# Patient Record
Sex: Female | Born: 1985 | Race: White | Hispanic: No | Marital: Married | Smoking: Never smoker
Health system: Southern US, Community
[De-identification: ages and names within clinical notes are randomized; demographics above are authoritative.]

## PROBLEM LIST (undated history)

## (undated) DIAGNOSIS — I341 Nonrheumatic mitral (valve) prolapse: Secondary | ICD-10-CM

## (undated) DIAGNOSIS — K219 Gastro-esophageal reflux disease without esophagitis: Secondary | ICD-10-CM

## (undated) DIAGNOSIS — D649 Anemia, unspecified: Secondary | ICD-10-CM

## (undated) DIAGNOSIS — G43909 Migraine, unspecified, not intractable, without status migrainosus: Secondary | ICD-10-CM

## (undated) DIAGNOSIS — I959 Hypotension, unspecified: Secondary | ICD-10-CM

## (undated) HISTORY — PX: TONSILLECTOMY AND ADENOIDECTOMY: SHX28

## (undated) HISTORY — PX: WISDOM TOOTH EXTRACTION: SHX21

## (undated) HISTORY — DX: Nonrheumatic mitral (valve) prolapse: I34.1

## (undated) HISTORY — DX: Migraine, unspecified, not intractable, without status migrainosus: G43.909

---
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
## 2007-08-13 ENCOUNTER — Emergency Department (HOSPITAL_COMMUNITY): Admission: EM | Admit: 2007-08-13 | Discharge: 2007-08-13 | Payer: Self-pay | Admitting: Family Medicine

## 2009-08-28 ENCOUNTER — Encounter: Admission: RE | Admit: 2009-08-28 | Discharge: 2009-08-28 | Payer: Self-pay | Admitting: Family Medicine

## 2011-09-25 ENCOUNTER — Ambulatory Visit (INDEPENDENT_AMBULATORY_CARE_PROVIDER_SITE_OTHER): Payer: 59 | Admitting: Internal Medicine

## 2011-09-25 VITALS — BP 110/68 | HR 67 | Temp 98.3°F | Resp 16 | Ht 64.0 in | Wt 120.0 lb

## 2011-09-25 DIAGNOSIS — L259 Unspecified contact dermatitis, unspecified cause: Secondary | ICD-10-CM

## 2011-09-25 MED ORDER — TRIAMCINOLONE ACETONIDE 0.1 % EX CREA: TOPICAL_CREAM | Freq: Two times a day (BID) | CUTANEOUS | Status: AC

## 2011-09-25 MED ORDER — PREDNISONE 20 MG PO TABS: ORAL_TABLET | ORAL | Status: AC

## 2011-09-25 NOTE — Progress Notes (Signed)
  Subjective:    Patient ID: Judy [name], female    DOB: 05-12-1986, 26 y.o.   MRN: [id number]  Rash This is a new problem. The current episode started in the past 7 days. The problem has been gradually worsening since onset. The rash is diffuse. The rash is characterized by blistering, itchiness, redness and draining. She was exposed to plant contact. Past treatments include anti-itch cream.  Judy [name] is here with a rash that appeared after working in the yard with her Mother.  She has had red bumps that are draining starting on her right lower arm, now on her trunk, back and extremities, very itchy.  She has had poison ivey before and saw some in the yard when she was working last week.  She is currently on her menses, denies pregnancy.    Review of Systems  Skin: Positive for rash.  All other systems reviewed and are negative.       Objective:   Physical Exam  Constitutional: She is oriented to person, place, and time. She appears well-developed and well-nourished.  Cardiovascular: Normal rate, regular rhythm and normal heart sounds.   Pulmonary/Chest: Effort normal and breath sounds normal. She has no wheezes.  Neurological: She is alert and oriented to person, place, and time.  Skin: Rash noted. There is erythema.       REd linear papular rash on back with scattered red papules on arms, chest, and large patch of yellow crusted papules on right lower arm.          Assessment & Plan:  Contact Dermatitis, diffuse.  Prednisone taper for nine days given.  Triamcinolone cream topically to trunk and extremities only (not to apply to face or genitals).  Pt agrees with plan.  AVS printed and given to pt.

## 2011-09-25 NOTE — Patient Instructions (Signed)
Use your cream and take your prednisone as directed.  REturn if not better in 2-3 days. Contact Dermatitis Contact dermatitis is a reaction to certain substances that touch the skin. Contact dermatitis can be either irritant contact dermatitis or allergic contact dermatitis. Irritant contact dermatitis does not require previous exposure to the substance for a reaction to occur.Allergic contact dermatitis only occurs if you have been exposed to the substance before. Upon a repeat exposure, your body reacts to the substance.  CAUSES  Many substances can cause contact dermatitis. Irritant dermatitis is most commonly caused by repeated exposure to mildly irritating substances, such as:  Makeup.   Soaps.   Detergents.   Bleaches.   Acids.   Metal salts, such as nickel.  Allergic contact dermatitis is most commonly caused by exposure to:  Poisonous plants.   Chemicals (deodorants, shampoos).   Jewelry.   Latex.   Neomycin in triple antibiotic cream.   Preservatives in products, including clothing.  SYMPTOMS  The area of skin that is exposed may develop:  Dryness or flaking.   Redness.   Cracks.   Itching.   Pain or a burning sensation.   Blisters.  With allergic contact dermatitis, there may also be swelling in areas such as the eyelids, mouth, or genitals.  DIAGNOSIS  Your caregiver can usually tell what the problem is by doing a physical exam. In cases where the cause is uncertain and an allergic contact dermatitis is suspected, a patch skin test may be performed to help determine the cause of your dermatitis. TREATMENT Treatment includes protecting the skin from further contact with the irritating substance by avoiding that substance if possible. Barrier creams, powders, and gloves may be helpful. Your caregiver may also recommend:  Steroid creams or ointments applied 2 times daily. For best results, soak the rash area in cool water for 20 minutes. Then apply the  medicine. Cover the area with a plastic wrap. You can store the steroid cream in the refrigerator for a "chilly" effect on your rash. That may decrease itching. Oral steroid medicines may be needed in more severe cases.   Antibiotics or antibacterial ointments if a skin infection is present.   Antihistamine lotion or an antihistamine taken by mouth to ease itching.   Lubricants to keep moisture in your skin.   Burow's solution to reduce redness and soreness or to dry a weeping rash. Mix one packet or tablet of solution in 2 cups cool water. Dip a clean washcloth in the mixture, wring it out a bit, and put it on the affected area. Leave the cloth in place for 30 minutes. Do this as often as possible throughout the day.   Taking several cornstarch or baking soda baths daily if the area is too large to cover with a washcloth.  Harsh chemicals, such as alkalis or acids, can cause skin damage that is like a burn. You should flush your skin for 15 to 20 minutes with cold water after such an exposure. You should also seek immediate medical care after exposure. Bandages (dressings), antibiotics, and pain medicine may be needed for severely irritated skin.  HOME CARE INSTRUCTIONS  Avoid the substance that caused your reaction.   Keep the area of skin that is affected away from hot water, soap, sunlight, chemicals, acidic substances, or anything else that would irritate your skin.   Do not scratch the rash. Scratching may cause the rash to become infected.   You may take cool baths to help stop  the itching.   Only take over-the-counter or prescription medicines as directed by your caregiver.   See your caregiver for follow-up care as directed to make sure your skin is healing properly.  SEEK MEDICAL CARE IF:   Your condition is not better after 3 days of treatment.   You seem to be getting worse.   You see signs of infection such as swelling, tenderness, redness, soreness, or warmth in the  affected area.   You have any problems related to your medicines.  Document Released: 06/19/2000 Document Revised: 06/11/2011 Document Reviewed: 11/25/2010 [facility] Patient Information 2012 [place], [place].

## 2011-11-11 DIAGNOSIS — I340 Nonrheumatic mitral (valve) insufficiency: Secondary | ICD-10-CM | POA: Insufficient documentation

## 2011-11-11 DIAGNOSIS — Q211 Atrial septal defect: Secondary | ICD-10-CM | POA: Insufficient documentation

## 2012-11-10 ENCOUNTER — Telehealth: Payer: Self-pay | Admitting: *Deleted

## 2012-11-10 NOTE — Telephone Encounter (Signed)
Pt R/S to 11/18/2012

## 2012-11-14 ENCOUNTER — Telehealth: Payer: Self-pay | Admitting: *Deleted

## 2012-11-14 NOTE — Telephone Encounter (Signed)
Left message for patient appointment.

## 2012-11-15 ENCOUNTER — Telehealth: Payer: Self-pay | Admitting: *Deleted

## 2012-11-15 NOTE — Telephone Encounter (Signed)
R/S patient to 11/22/2012

## 2012-11-16 ENCOUNTER — Ambulatory Visit: Payer: 59 | Admitting: Neurology

## 2012-11-18 ENCOUNTER — Ambulatory Visit: Payer: Self-pay | Admitting: Neurology

## 2012-11-21 ENCOUNTER — Ambulatory Visit: Payer: Self-pay | Admitting: Neurology

## 2012-11-22 ENCOUNTER — Encounter: Payer: Self-pay | Admitting: Neurology

## 2012-11-22 ENCOUNTER — Ambulatory Visit (INDEPENDENT_AMBULATORY_CARE_PROVIDER_SITE_OTHER): Payer: 59 | Admitting: Neurology

## 2012-11-22 VITALS — BP 101/65 | HR 63 | Ht 64.0 in | Wt 121.0 lb

## 2012-11-22 DIAGNOSIS — G43909 Migraine, unspecified, not intractable, without status migrainosus: Secondary | ICD-10-CM

## 2012-11-22 MED ORDER — NORTRIPTYLINE HCL 10 MG PO CAPS: 20.0000 mg | ORAL_CAPSULE | Freq: Every day | ORAL | Status: DC

## 2012-11-22 NOTE — Patient Instructions (Signed)
Magnesium oxide 400mg bid, riboflavin 100mg bid. 

## 2012-11-22 NOTE — Progress Notes (Signed)
History of present illness:  Judy [name] is a 27 years old right-handed Caucasian female, referred by her primary care physician for evaluation of migraine headaches She presented with migraine headaches since age 43, her typical migraine that provides severe pounding headaches, with associated light noise sensitivity, sometimes nausea, sleep always helps,  Trigger for her migraines are stress, sleep deprivation, hungry, whether change, premenstrual cycle, she usually has visual aura, blurry vision, tunnel vision lasting 30 minutes before the onset of severe headaches  She has had migraine a few times a year, but in past one year , she has been has more frequent prolonged, more severe headaches, she had 9 migraines over past 2 months,    she has tried Imitrex in the past, causing flushing, no help, Maxalt was helpful initially, but  loose  its effectiveness after she used it for a while, Midrin was helpful but no longer in the market  She was given prescription of isometheptena-caffeine Tylenol by her primary care physician, which has been very effective, it takes away her headache in 30 minutes,  In between headaches, she is normal, she wants to find a way to cut back her frequency and severity of the headaches,  Review of Systems  Out of a complete 14 system review, the patient complains of only the following symptoms, and all other reviewed systems are negative.   Constitutional:   N/A Cardiovascular:  N/A Ear/Nose/Throat:  N/A Skin: N/A Eyes: N/A Respiratory: N/A Gastroitestinal: N/A    Hematology/Lymphatic:  N/A Endocrine:  N/A Musculoskeletal:N/A Allergy/Immunology: allergy Neurological: headache Psychiatric:    Decreased energy  PHYSICAL EXAMINATOINS:  Generalized: In no acute distress  Neck: Supple, no carotid bruits   Cardiac: Regular rate rhythm  Pulmonary: Clear to auscultation bilaterally  Musculoskeletal: No deformity  Neurological examination  Mentation: Alert  oriented to time, place, history taking, and causual conversation  Cranial nerve II-XII: Pupils were equal round reactive to light extraocular movements were full, visual field were full on confrontational test. facial sensation and strength were normal. hearing was intact to finger rubbing bilaterally. Uvula tongue midline.  head turning and shoulder shrug and were normal and symmetric.Tongue protrusion into cheek strength was normal.  Motor: normal tone, bulk and strength.  Sensory: Intact to fine touch, pinprick, preserved vibratory sensation, and proprioception at toes.  Coordination: Normal finger to nose, heel-to-shin bilaterally there was no truncal ataxia  Gait: Rising up from seated position without assistance, normal stance, without trunk ataxia, moderate stride, good arm swing, smooth turning, able to perform tiptoe, and heel walking without difficulty.   Romberg signs: Negative  Deep tendon reflexes: Brachioradialis 2/2, biceps 2/2, triceps 2/2, patellar 2/2, Achilles 2/2, plantar responses were flexor bilaterally.   Assessment and plan:  27 Years old right-handed Caucasian female, with past medical history of migraine headaches, now with increased frequency, normal neurological examination  1: Stop preventive medications, nortriptylin 10 mg titrating to 20 mg every night, she may also consider over-the-counter magnesium oxide, riboflavin,. 2 continue preventive medication Isomethept-caffeine-Tylenol as needed 3 return to clinic with Judy [name] in 3 months.

## 2013-02-23 ENCOUNTER — Telehealth: Payer: Self-pay | Admitting: Nurse Practitioner

## 2013-02-24 ENCOUNTER — Ambulatory Visit: Payer: 59 | Admitting: Nurse Practitioner

## 2013-08-02 DIAGNOSIS — Z3490 Encounter for supervision of normal pregnancy, unspecified, unspecified trimester: Secondary | ICD-10-CM | POA: Insufficient documentation

## 2013-08-24 NOTE — Telephone Encounter (Signed)
Pt was a no show closing encounter °

## 2014-01-07 ENCOUNTER — Inpatient Hospital Stay: Payer: Self-pay | Admitting: Obstetrics & Gynecology

## 2014-01-07 LAB — CBC WITH DIFFERENTIAL/PLATELET
Basophil #: 0 10*3/uL (ref 0.0–0.1)
Basophil %: 0.3 %
Eosinophil #: 0.1 10*3/uL (ref 0.0–0.7)
Eosinophil %: 1.1 %
HCT: 35.1 % (ref 35.0–47.0)
HGB: 11.5 g/dL — AB (ref 12.0–16.0)
Lymphocyte #: 1.7 10*3/uL (ref 1.0–3.6)
Lymphocyte %: 14.6 %
MCH: 31 pg (ref 26.0–34.0)
MCHC: 32.9 g/dL (ref 32.0–36.0)
MCV: 94 fL (ref 80–100)
MONOS PCT: 5.9 %
Monocyte #: 0.7 x10 3/mm (ref 0.2–0.9)
Neutrophil #: 9.3 10*3/uL — ABNORMAL HIGH (ref 1.4–6.5)
Neutrophil %: 78.1 %
Platelet: 201 10*3/uL (ref 150–440)
RBC: 3.72 10*6/uL — AB (ref 3.80–5.20)
RDW: 13.8 % (ref 11.5–14.5)
WBC: 11.9 10*3/uL — ABNORMAL HIGH (ref 3.6–11.0)

## 2014-01-08 ENCOUNTER — Inpatient Hospital Stay (HOSPITAL_COMMUNITY)
Admission: AD | Admit: 2014-01-08 | Discharge: 2014-01-09 | DRG: 776 | Disposition: A | Discharge status: Home / Self Care | Payer: 59 | Source: Ambulatory Visit | Attending: Family Medicine | Admitting: Family Medicine

## 2014-01-08 ENCOUNTER — Encounter (HOSPITAL_COMMUNITY): Payer: Self-pay | Admitting: *Deleted

## 2014-01-08 DIAGNOSIS — O9989 Other specified diseases and conditions complicating pregnancy, childbirth and the puerperium: Secondary | ICD-10-CM

## 2014-01-08 DIAGNOSIS — Z09 Encounter for follow-up examination after completed treatment for conditions other than malignant neoplasm: Secondary | ICD-10-CM

## 2014-01-08 DIAGNOSIS — I2510 Atherosclerotic heart disease of native coronary artery without angina pectoris: Secondary | ICD-10-CM | POA: Diagnosis present

## 2014-01-08 DIAGNOSIS — O9943 Diseases of the circulatory system complicating the puerperium: Secondary | ICD-10-CM

## 2014-01-08 DIAGNOSIS — I059 Rheumatic mitral valve disease, unspecified: Secondary | ICD-10-CM | POA: Diagnosis present

## 2014-01-08 DIAGNOSIS — O99893 Other specified diseases and conditions complicating puerperium: Secondary | ICD-10-CM

## 2014-01-08 DIAGNOSIS — G43909 Migraine, unspecified, not intractable, without status migrainosus: Secondary | ICD-10-CM

## 2014-01-08 LAB — OB RESULTS CONSOLE ABO/RH: RH Type: POSITIVE

## 2014-01-08 LAB — OB RESULTS CONSOLE HIV ANTIBODY (ROUTINE TESTING)
HIV: NONREACTIVE
HIV: NONREACTIVE

## 2014-01-08 LAB — OB RESULTS CONSOLE HEPATITIS B SURFACE ANTIGEN: Hepatitis B Surface Ag: NEGATIVE

## 2014-01-08 LAB — OB RESULTS CONSOLE GBS
GBS: NEGATIVE
STREP GROUP B AG: NEGATIVE

## 2014-01-08 LAB — OB RESULTS CONSOLE ANTIBODY SCREEN: ANTIBODY SCREEN: NEGATIVE

## 2014-01-08 LAB — OB RESULTS CONSOLE RUBELLA ANTIBODY, IGM: RUBELLA: IMMUNE

## 2014-01-08 MED ORDER — OXYCODONE-ACETAMINOPHEN 5-325 MG PO TABS
1.0000 | ORAL_TABLET | ORAL | Status: DC | PRN
  Administered 2014-01-09 (×2): 1 via ORAL
  Filled 2014-01-08 (×2): qty 1

## 2014-01-08 MED ORDER — SIMETHICONE 80 MG PO CHEW
80.0000 mg | CHEWABLE_TABLET | ORAL | Status: DC
  Administered 2014-01-08: 80 mg via ORAL
  Filled 2014-01-08: qty 1

## 2014-01-08 MED ORDER — MENTHOL 3 MG MT LOZG: 1.0000 | LOZENGE | OROMUCOSAL | Status: DC | PRN

## 2014-01-08 MED ORDER — LACTATED RINGERS IV SOLN
INTRAVENOUS | Status: DC
  Administered 2014-01-08: 23:00:00 via INTRAVENOUS

## 2014-01-08 MED ORDER — ZOLPIDEM TARTRATE 5 MG PO TABS: 5.0000 mg | ORAL_TABLET | Freq: Every evening | ORAL | Status: DC | PRN

## 2014-01-08 MED ORDER — PRENATAL MULTIVITAMIN CH
1.0000 | ORAL_TABLET | Freq: Every day | ORAL | Status: DC
  Administered 2014-01-09: 1 via ORAL
  Filled 2014-01-08: qty 1

## 2014-01-08 MED ORDER — ONDANSETRON HCL 4 MG/2ML IJ SOLN: 4.0000 mg | INTRAMUSCULAR | Status: DC | PRN

## 2014-01-08 MED ORDER — WITCH HAZEL-GLYCERIN EX PADS: 1.0000 "application " | MEDICATED_PAD | CUTANEOUS | Status: DC | PRN

## 2014-01-08 MED ORDER — SIMETHICONE 80 MG PO CHEW
80.0000 mg | CHEWABLE_TABLET | Freq: Three times a day (TID) | ORAL | Status: DC
  Administered 2014-01-08: 80 mg via ORAL
  Filled 2014-01-08: qty 1

## 2014-01-08 MED ORDER — IBUPROFEN 600 MG PO TABS
600.0000 mg | ORAL_TABLET | Freq: Four times a day (QID) | ORAL | Status: DC
  Administered 2014-01-08 – 2014-01-09 (×5): 600 mg via ORAL
  Filled 2014-01-08 (×5): qty 1

## 2014-01-08 MED ORDER — DIPHENHYDRAMINE HCL 25 MG PO CAPS: 25.0000 mg | ORAL_CAPSULE | Freq: Four times a day (QID) | ORAL | Status: DC | PRN

## 2014-01-08 MED ORDER — DIBUCAINE 1 % RE OINT: 1.0000 "application " | TOPICAL_OINTMENT | RECTAL | Status: DC | PRN

## 2014-01-08 MED ORDER — ONDANSETRON HCL 4 MG PO TABS: 4.0000 mg | ORAL_TABLET | ORAL | Status: DC | PRN

## 2014-01-08 MED ORDER — LANOLIN HYDROUS EX OINT: 1.0000 "application " | TOPICAL_OINTMENT | CUTANEOUS | Status: DC | PRN

## 2014-01-08 MED ORDER — OXYTOCIN 40 UNITS IN LACTATED RINGERS INFUSION - SIMPLE MED: 62.5000 mL/h | INTRAVENOUS | Status: AC

## 2014-01-08 MED ORDER — SENNOSIDES-DOCUSATE SODIUM 8.6-50 MG PO TABS
2.0000 | ORAL_TABLET | ORAL | Status: DC
  Administered 2014-01-08: 2 via ORAL
  Filled 2014-01-08: qty 2

## 2014-01-08 MED ORDER — TETANUS-DIPHTH-ACELL PERTUSSIS 5-2.5-18.5 LF-MCG/0.5 IM SUSP: 0.5000 mL | Freq: Once | INTRAMUSCULAR | Status: DC

## 2014-01-08 MED ORDER — SIMETHICONE 80 MG PO CHEW: 80.0000 mg | CHEWABLE_TABLET | ORAL | Status: DC | PRN

## 2014-01-08 NOTE — Progress Notes (Signed)
I have been checking in on her family since this morning when I met them in the NICU.  I introduced myself to Judy [name], as she and her husband stood outside of the NICU.  She is overwhelmed and in shock at this point.  We will continue to provide her with support over the coming days, but please also page as needs arise.  [employer] Pager, [phone] 4:25 PM   01/08/14 1600  Clinical Encounter Type  Visited With Patient and family together  Visit Type Spiritual support

## 2014-01-08 NOTE — H&P (Signed)
Judy [name] is a 28 y.o. female G2P1001 S/P C-Section at [facility]. Her baby was transferred for care in the NICU, and she was transferred here as well to be near her baby. She experienced no intrapartum or postpartum complications. Baby was transferred 2/2 being delivered through meconium stained fluid, and difficulty with resuscitation. She currently, has pain well controlled on PCA pump. She has some mild swelling in her feet. She denies SOB, fever, chills, nausea, or vomiting. She is hungry and will try to eat this evening. She has been able to move around well with little limitation due to the pain.  Pertinent medical problems include mitral valve prolapse.  She is currently comfortable.   Prenatal History/Complications:  Past Medical History: Past Medical History  Diagnosis Date  . Migraines   . MVP (mitral valve prolapse)     Past Surgical History: Past Surgical History  Procedure Laterality Date  . Tonsillectomy and adenoidectomy    . Wisdom tooth extraction    . Cesarean section      Obstetrical History: OB History   Grav Para Term Preterm Abortions TAB SAB Ect Mult Living   2 1 1  0 0 0 0 0 0 1      Gynecological History: OB History   Grav Para Term Preterm Abortions TAB SAB Ect Mult Living   2 1 1  0 0 0 0 0 0 1      Social History: History   Social History  . Marital Status: Single    Spouse Name: John    Number of Children: 0  . Years of Education: college   Occupational History  . working in [employer]     [employer].   Social History Main Topics  . Smoking status: Never Smoker   . Smokeless tobacco: Never Used  . Alcohol Use: No     Comment: Rare  . Drug Use: No  . Sexual Activity: Yes   Other Topics Concern  . None   Social History Narrative   Patient lives at home with husband Jonny Ruiz(John). Patient does not have children. 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     Family History: No family history on  file.  Allergies: Allergies  Allergen Reactions  . Augmentin [Amoxicillin-Pot Clavulanate] Nausea And Vomiting  . Clindamycin/Lincomycin Rash    Prescriptions prior to admission  Medication Sig Dispense Refill  . APAP-Isometheptene-Caffeine 325-65-100 MG CAPS Take by mouth as directed.      Marland Kitchen. Fexofenadine HCl (ALLEGRA PO) Take by mouth daily.      . norethindrone (ORTHO MICRONOR) 0.35 MG tablet Take 1 tablet by mouth daily.      . nortriptyline (PAMELOR) 10 MG capsule Take 2 capsules (20 mg total) by mouth at bedtime. One po qhs xone week.  60 capsule  12     Review of Systems   Constitutional: As above  Blood pressure 109/72, pulse 89, temperature 98.1 F (36.7 C), temperature source Oral, resp. rate 16, height 5\' 4"  (1.626 m), weight 77.111 kg (170 lb), SpO2 97.00%, currently breastfeeding. General appearance: alert, cooperative and no distress Lungs: clear to auscultation bilaterally Heart: regular rate and rhythm, systolic click   Abdomen: soft, mildly tender 2/2 c-section, incision c/d/i, pca in place, no audible bowel sounds at this time.  Pelvic: did not perform 2/2 recent c-section Extremities: Homans sign is negative, no sign of DVT, no calf tenderness, mild lower extremity swelling.  Neurologically grossly intact Presentation: s/p delivery Fetal monitorings/p delivery  Uterine activitys/p delivery         Results for orders placed during the hospital encounter of 01/08/14 (from the past 24 hour(s))  OB RESULTS CONSOLE GBS   Collection Time    01/08/14 12:00 AM      Result Value Ref Range   GBS Negative    OB RESULTS CONSOLE GBS   Collection Time    01/08/14 12:00 AM      Result Value Ref Range   GBS Negative    OB RESULTS CONSOLE HIV ANTIBODY (ROUTINE TESTING)   Collection Time    01/08/14 12:00 AM      Result Value Ref Range   HIV Non-reactive    OB RESULTS CONSOLE RUBELLA ANTIBODY, IGM   Collection Time    01/08/14 12:00 AM      Result Value Ref  Range   Rubella Immune    OB RESULTS CONSOLE HEPATITIS B SURFACE ANTIGEN   Collection Time    01/08/14 12:00 AM      Result Value Ref Range   Hepatitis B Surface Ag Negative    OB RESULTS CONSOLE HIV ANTIBODY (ROUTINE TESTING)   Collection Time    01/08/14 12:00 AM      Result Value Ref Range   HIV Non-reactive    OB RESULTS CONSOLE ABO/RH   Collection Time    01/08/14 12:00 AM      Result Value Ref Range   RH Type  Positive     ABO Grouping O    OB RESULTS CONSOLE ANTIBODY SCREEN   Collection Time    01/08/14 12:00 AM      Result Value Ref Range   Antibody Screen Negative      Assessment: Judy [name] is a 28 y.o. G2P1001 here for care s/p cesarean delivery POD#0. Here with baby.  #Labor: s/p cesarean delivery #Pain: Controlled on PCA #FWB: In NICU #ID:  N/A # Course: Mother to recover here with baby prior to discharge home.   Melancon, Hillery HunterCaleb G 01/08/2014, 4:13 PM  Evaluation and management procedures were performed by Resident physician under my supervision/collaboration. Chart reviewed, patient examined by me and I agree with management and plan.

## 2014-01-08 NOTE — Consult Note (Signed)
Judy Levinshristine Anne Lilli Dewald, RN Registered Nurse Addendum Lactation Lactation Note Service date: 01/08/2014 2:08 PM   Lactation Consultation Note Initial consult with this mom of a term baby, [facility], with diagnosis of meconium aspiration, perinatal depression, on a cooling blanket. Mom was also [facility], s/p C-section this morning. Mom wants to provide EBM for her baby, and has pumped once since [facility]. I entered mom's room, and the room was full of visitors. Dad also present. Mom asked that I return later to do DEP teaching with her, so i left her the NLICU booklet on how to provide EBm for a NICU baby, and her labels, and told her I would wait for her nurse to call me, before returning.  Patient Name: Judy [name]  ZOXWR'UToday's Date: 01/08/2014  Reason for consult: Initial assessment;NICU baby

## 2014-01-09 LAB — CBC
HCT: 30.6 % — ABNORMAL LOW (ref 36.0–46.0)
Hemoglobin: 9.8 g/dL — ABNORMAL LOW (ref 12.0–15.0)
MCH: 30.4 pg (ref 26.0–34.0)
MCHC: 32 g/dL (ref 30.0–36.0)
MCV: 95 fL (ref 78.0–100.0)
PLATELETS: 167 10*3/uL (ref 150–400)
RBC: 3.22 MIL/uL — ABNORMAL LOW (ref 3.87–5.11)
RDW: 13.9 % (ref 11.5–15.5)
WBC: 12.2 10*3/uL — AB (ref 4.0–10.5)

## 2014-01-09 LAB — RPR

## 2014-01-09 MED ORDER — SENNA-DOCUSATE SODIUM 8.6-50 MG PO TABS: 2.0000 | ORAL_TABLET | Freq: Every day | ORAL | Status: DC

## 2014-01-09 MED ORDER — IBUPROFEN 600 MG PO TABS: 600.0000 mg | ORAL_TABLET | Freq: Four times a day (QID) | ORAL | Status: DC

## 2014-01-09 MED ORDER — OXYCODONE-ACETAMINOPHEN 5-325 MG PO TABS: 1.0000 | ORAL_TABLET | ORAL | Status: DC | PRN

## 2014-01-09 NOTE — Progress Notes (Signed)
Informed MD of patient's [facility] being [facility].  Will continue to monitor.  Vivi MartensAshley Dwight Burdo RN

## 2014-01-09 NOTE — Progress Notes (Signed)
I have seen this patient and agree with the above resident's note.  Pt PCA pump D/C'd, still has QT pump.  Pain well controlled.   LEFTWICH-KIRBY, Pualani Borah Certified Nurse-Midwife

## 2014-01-09 NOTE — Lactation Note (Signed)
Lactation Consultation Note: Mothers [facility]. Mother received a Medela pump n style advanced from her insurance company . She has this pump at home. Mother was unable to return to home to get her pump. She was allowed a 2 week rental. Reviewed pumping every 2-3 hours for 15-20 mins. Reviewed collection and storage guidelines with mother. Mother request more colostrum bottles. [facility]. She was advised that she will be able to use an electric pump at the hospital as needed. Mother plans to return [facility] within 2 weeks. Mother was given LC follow information as needed.   Patient Name: Judy [name] ZOXWR'UToday's Date: 01/09/2014     Maternal Data    Feeding    LATCH Score/Interventions                      Lactation Tools Discussed/Used     Consult Status      Judy [name], Judy [name] 01/09/2014, 6:56 PM

## 2014-01-09 NOTE — Progress Notes (Signed)
Subjective: Postpartum Day 1: Cesarean Delivery via PLTCS 2/2 NR FHR Patient reports mild controlled incisional pain, tolerating PO, + flatus and no problems voiding.  She has SCD's in place. Baby in NICU 2/2 MAS. Comfortable this am. No other complaints.   Objective: Vital signs in last 24 hours: Temp:  [97.5 F (36.4 C)-98.1 F (36.7 C)] 97.9 F (36.6 C) (07/07 0619) Pulse Rate:  [60-89] 60 (07/07 0619) Resp:  [16] 16 (07/07 0619) BP: (96-118)/(62-73) 101/62 mmHg (07/07 0619) SpO2:  [95 %-97 %] 97 % (07/07 0619) Weight:  [77.111 kg (170 lb)] 77.111 kg (170 lb) (07/06 1150)  Physical Exam:  General: alert, cooperative and no distress Lochia: appropriate Uterine Fundus: firm Incision: healing well, no dehiscence, no significant erythema, slight clear drainage present DVT Evaluation: No evidence of DVT seen on physical exam. No cords or calf tenderness. SCD's in place.    Recent Labs  01/09/14 0550  HGB 9.8*  HCT 30.6*    Assessment/Plan: Status post Cesarean section. Doing well postoperatively.  Continue current care. Pain well controlled with PCA pump.  Will continue to monitor postoperatively. Likely discharge tomorrow.   Quentin Shorey G 01/09/2014, 7:18 AM

## 2014-01-09 NOTE — Progress Notes (Signed)
Provided parents with information for staying [facility] [place] by request of MOB's bedside RN.

## 2014-01-09 NOTE — Progress Notes (Signed)
UR chart review completed.  

## 2014-01-09 NOTE — H&P (Signed)
Attestation of Attending Supervision of Advanced Practitioner (PA/CNM/NP): Evaluation and management procedures were performed by the Advanced Practitioner under my supervision and collaboration.  I have reviewed the Advanced Practitioner's note and chart, and I agree with the management and plan.  Farra Nikolic S, MD [facility] Attending 01/09/2014 8:32 AM   

## 2014-01-09 NOTE — Discharge Summary (Signed)
Obstetric Discharge Summary Reason for Admission:  s/p pLTCS for fetal distress at [facility] with NICU transfer of baby Prenatal Procedures: none Postpartum Procedures: observation, On-Q infusion pain pump Complications-Operative and Postpartum: none Hemoglobin  Date Value Ref Range Status  01/09/2014 9.8* 12.0 - 15.0 g/dL Final     HCT  Date Value Ref Range Status  01/09/2014 30.6* 36.0 - 46.0 % Final    Hospital Course Pt delivered [facility] 7[facility]6 [facility] via primary c-section for fetal distress.  Post delivery, there was concern for meconium aspiration syndrome so baby was transferred here for NICU care and Mom was transferred to be close to baby.  She presented with an On-Q pump for pain control. Hgb went from 11.5 to 9.8.  On POD#1, she was meeting all milestones and baby became sicker. The decision was made to [facility]. As mom was meeting all milestones, the decision was made to discharge her in order for her to go with her baby.  She will f/u in her obgyn office in [place] in 2 days to remove the On-Q pump.    Physical Exam:  General: alert, cooperative and no distress Lochia: appropriate Uterine Fundus: firm Incision: healing well, no significant drainage, no significant erythema, On-Q pump in place DVT Evaluation: No evidence of DVT seen on physical exam.  Discharge Diagnoses: Term Pregnancy-delivered  Discharge Information: Date: 01/09/2014 Activity: pelvic rest Diet: routine Medications: PNV, Ibuprofen and Percocet Condition: stable Instructions: refer to practice specific booklet Discharge to: home   Newborn Data: Baby Boy transferred to NICU here for meconium aspiration and then [facility] for Ecmo on day 1 of life.   BECK, KELI L 01/09/2014, 5:23 PM   Attestation of Attending Supervision of Fellow: Evaluation and management procedures were performed by the Fellow under my supervision  and collaboration. I have reviewed the Fellow's note and chart, and I agree with the management and plan.

## 2014-01-09 NOTE — Discharge Instructions (Signed)
Cesarean Delivery °Care After °Refer to this sheet in the next few weeks. These instructions provide you with information on caring for yourself after your procedure. Your health care provider may also give you specific instructions. Your treatment has been planned according to current medical practices, but problems sometimes occur. Call your health care provider if you have any problems or questions after you go home. °HOME CARE INSTRUCTIONS  °· Only take over-the-counter or prescription medications as directed by your health care provider. °· Do not drink alcohol, especially if you are breastfeeding or taking medication to relieve pain. °· Do not chew or smoke tobacco. °· Continue to use good perineal care. Good perineal care includes: °¨ Wiping your perineum from front to back. °¨ Keeping your perineum clean. °· Check your surgical cut (incision) daily for increased redness, drainage, swelling, or separation of skin. °· Clean your incision gently with soap and water every day, and then pat it dry. If your health care provider says it is OK, leave the incision uncovered. Use a bandage (dressing) if the incision is draining fluid or appears irritated. If the adhesive strips across the incision do not fall off within 7 days, carefully peel them off. °· Hug a pillow when coughing or sneezing until your incision is healed. This helps to relieve pain. °· Do not use tampons or douche until your health care provider says it is okay. °· Shower, wash your hair, and take tub baths as directed by your health care provider. °· Wear a well-fitting bra that provides breast support. °· Limit wearing support panties or control-top hose. °· Drink enough fluids to keep your urine clear or pale yellow. °· Eat high-fiber foods such as whole grain cereals and breads, brown rice, beans, and fresh fruits and vegetables every day. These foods may help prevent or relieve constipation. °· Resume activities such as climbing stairs,  driving, lifting, exercising, or traveling as directed by your health care provider. °· Talk to your health care provider about resuming sexual activities. This is dependent upon your risk of infection, your rate of healing, and your comfort and desire to resume sexual activity. °· Try to have someone help you with your household activities and your newborn for at least a few days after you leave the hospital. °· Rest as much as possible. Try to rest or take a nap when your newborn is sleeping. °· Increase your activities gradually. °· Keep all of your scheduled postpartum appointments. It is very important to keep your scheduled follow-up appointments. At these appointments, your health care provider will be checking to make sure that you are healing physically and emotionally. °SEEK MEDICAL CARE IF:  °· You are passing large clots from your vagina. Save any clots to show your health care provider. °· You have a foul smelling discharge from your vagina. °· You have trouble urinating. °· You are urinating frequently. °· You have pain when you urinate. °· You have a change in your bowel movements. °· You have increasing redness, pain, or swelling near your incision. °· You have pus draining from your incision. °· Your incision is separating. °· You have painful, hard, or reddened breasts. °· You have a severe headache. °· You have blurred vision or see spots. °· You feel sad or depressed. °· You have thoughts of hurting yourself or your newborn. °· You have questions about your care, the care of your newborn, or medications. °· You are dizzy or lightheaded. °· You have a rash. °· You   have pain, redness, or swelling at the site of the removed intravenous access (IV) tube. °· You have nausea or vomiting. °· You stopped breastfeeding and have not had a menstrual period within 12 weeks of stopping. °· You are not breastfeeding and have not had a menstrual period within 12 weeks of delivery. °· You have a fever. °SEEK  IMMEDIATE MEDICAL CARE IF: °· You have persistent pain. °· You have chest pain. °· You have shortness of breath. °· You faint. °· You have leg pain. °· You have stomach pain. °· Your vaginal bleeding saturates 2 or more sanitary pads in 1 hour. °MAKE SURE YOU:  °· Understand these instructions. °· Will watch your condition. °· Will get help right away if you are not doing well or get worse. °Document Released: 03/14/2002 Document Revised: 02/22/2013 Document Reviewed: 02/17/2012 °ExitCare® Patient Information ©2015 [employer]. This information is not intended to replace advice given to you by your health care provider. Make sure you discuss any questions you have with your health care provider. ° °

## 2014-01-11 LAB — PATHOLOGY REPORT

## 2014-05-07 ENCOUNTER — Encounter (HOSPITAL_COMMUNITY): Payer: Self-pay | Admitting: *Deleted

## 2014-10-27 NOTE — Op Note (Signed)
PATIENT NAME:  Judy [name], Judy [name] MR#:  [id number] DATE OF BIRTH:  Feb 01, 1986  DATE OF PROCEDURE:  01/08/2014.  PREOPERATIVE DIAGNOSIS:  Post-term pregnancy, fetal intolerance to labor with recurrent late decelerations.   POSTOPERATIVE DIAGNOSIS:  Post-term pregnancy, fetal intolerance to labor with recurrent late decelerations, with thick meconium.   PROCEDURE PERFORMED: Low transverse cesarean section, placement of On-Q pain pump.   SURGEON: Annamarie MajorPaul Kazim Corrales, M.[name].   ASSISTANT: Elizebeth BrookingAmanda Orr.  ANESTHESIA: Spinal.   ESTIMATED BLOOD LOSS: 250 mL.   COMPLICATIONS: None.   FINDINGS: There was thick meconium noted at the artificial rupture of membranes. There was a nuchal cord, although it was not tight around the neck. The infant weighed 6 pounds, 4 ounces with Apgar scores of 1 and 8 at 1 minute and 5 minutes, respectively. The patient otherwise had normal tubes, ovaries, and uterus.   DISPOSITION: To the recovery room in stable condition.   TECHNIQUE: The patient was prepped and draped in the usual sterile fashion after adequate anesthesia was  obtained in the supine position on the operating table. Scalpel was used to create a low transverse skin incision down to the level of the rectus fascia which was dissected bilaterally using Mayo scissors. Rectus muscles were separated in the midline, the midline of the peritoneum was penetrated and a retractor was placed after dissection of the bladder in an inferior fashion. A scalpel was used to create a low transverse hysterotomy incision. An amniotomy was then performed with an Allis clamp with thick meconium noted. The infant's head was grasped and delivered and the nuchal cord was reduced. The remaining portion of the infant was delivered and the nuchal cord was reduced.  The remaining portion of the infant was delivered with the nuchal cord clamped and cut and the infant was handed to the pediatric team immediately with minimal cry.   Cord blood and  blood gases were obtained, the placenta was manually extracted. The uterus was externalized and cleansed of all membranes and debris using a moist sponge. The hysterotomy incision was closed with a running #1 Vicryl suture in a locking fashion followed by a 2nd layer to imbricate the 1st layer with excellent hemostasis noted. The uterus was placed back in the intraabdominal cavity and the paracolic gutters were irrigated with copious amounts of saline.   Re-examination of the incision revealed excellent hemostasis. The peritoneum was closed with plication of the rectus abdominis muscles. The On-Q pain pump catheter trocars were then placed through the abdomen into the subfascial space with placement of the Silver Soaker catheters at this time. The fascia was closed with a 0-Maxon suture with careful placement so as not to incorporate these catheters. The catheters were flushed with 5 mL in each of bupivacaine and stabilized into place with Steri-Strips and a Tegaderm bandage.   Subcutaneous tissues were irrigated and hemostasis was assured using electrocautery. Skin was closed with a 4-0 Vicryl suture in a subcuticular fashion followed by placement of Dermabond. The patient went to the recovery room in stable condition. All sponge, instrument, and needle counts were correct.    ____________________________ R. Annamarie MajorPaul Arron Mcnaught, MD rph:lt [name]: 01/08/2014 [id number]:21:22 ET T: 01/08/2014 06:53:13 ET JOB#: [id number]  cc: Dierdre Searles. Paul Druscilla Petsch, MD, <Dictator> Nadara MustardOBERT P Jonique Kulig MD ELECTRONICALLY SIGNED 01/09/2014 9:28

## 2014-11-13 NOTE — H&P (Signed)
L&D Evaluation:  History Expanded:  HPI 29 yo G1P0 w estimated date of confinement 12/30/13 for Induction of labor.  See H&P.   Gravida 1   Term 0   Maternal Varicella Immune   Rubella Results (Maternal) immune   Maternal T-Dap Immune   [facility]   Patient's Medical History MVR   Patient's Surgical History none   Medications Pre Natal Vitamins   Allergies NKDA   Social History none   Family History Non-Contributory   ROS:  ROS All systems were reviewed.  HEENT, CNS, GI, GU, Respiratory, CV, Renal and Musculoskeletal systems were found to be normal.   Exam:  Vital Signs stable   General no apparent distress   Mental Status clear   Chest clear   Heart normal sinus rhythm, no murmur/gallop/rubs   Abdomen gravid, non-tender   Estimated Fetal Weight Average for gestational age   Back no CVAT   Edema no edema   Pelvic no external lesions, 1/80   Mebranes Intact   FHT normal rate with no decels   Ucx absent   Skin no lesions   Impression:  Impression Induction of labor for POSTTERM   Plan:  Plan EFM/NST, monitor contractions and for cervical change   Comments Pt has been fully informed of the pros and cons, risk/benefits continued close observation versus the risks of induction. She understands that there are uncommon risks to induction, which include but are not limited to:  frequent and/or prolonged uterine contractions, fetal distress, uterine rupture and lack of success of induction.  She also has been informed that if the induction is not successful a Cesarean Section may be necessary.  All questions have been answered and she is in agreement with induction.   Electronic Signatures: Letitia LibraHarris, Robert Paul (MD)  (Signed 05-Jul-15 15:24)  Authored: L&D Evaluation   Last Updated: 05-Jul-15 15:24 by Letitia LibraHarris, Robert Paul (MD)

## 2015-06-16 ENCOUNTER — Ambulatory Visit (INDEPENDENT_AMBULATORY_CARE_PROVIDER_SITE_OTHER): Payer: Managed Care, Other (non HMO) | Admitting: Family Medicine

## 2015-06-16 VITALS — BP 110/62 | HR 52 | Temp 98.6°F | Resp 16 | Ht 64.5 in | Wt 115.4 lb

## 2015-06-16 DIAGNOSIS — N921 Excessive and frequent menstruation with irregular cycle: Secondary | ICD-10-CM | POA: Diagnosis not present

## 2015-06-16 DIAGNOSIS — Z3009 Encounter for other general counseling and advice on contraception: Secondary | ICD-10-CM | POA: Diagnosis not present

## 2015-06-16 DIAGNOSIS — D62 Acute posthemorrhagic anemia: Secondary | ICD-10-CM | POA: Diagnosis not present

## 2015-06-16 LAB — POCT CBC
Granulocyte percent: 53.1 %G (ref 37–80)
HCT, POC: 36.3 % — AB (ref 37.7–47.9)
Hemoglobin: 12.3 g/dL (ref 12.2–16.2)
Lymph, poc: 2.4 (ref 0.6–3.4)
MCH, POC: 29.5 pg (ref 27–31.2)
MCHC: 33.9 g/dL (ref 31.8–35.4)
MCV: 87 fL (ref 80–97)
MID (cbc): 0.6 (ref 0–0.9)
MPV: 7.1 fL (ref 0–99.8)
POC Granulocyte: 3.3 (ref 2–6.9)
POC LYMPH PERCENT: 37.6 %L (ref 10–50)
POC MID %: 9.3 %M (ref 0–12)
Platelet Count, POC: 216 10*3/uL (ref 142–424)
RBC: 4.17 M/uL (ref 4.04–5.48)
RDW, POC: 13.1 %
WBC: 6.3 10*3/uL (ref 4.6–10.2)

## 2015-06-16 MED ORDER — PRENATAL MULTIVITAMIN CH: 1.0000 | ORAL_TABLET | Freq: Every day | ORAL | Status: DC

## 2015-06-16 MED ORDER — NORETHINDRONE 0.35 MG PO TABS: 1.0000 | ORAL_TABLET | Freq: Every day | ORAL | Status: DC

## 2015-06-16 NOTE — Patient Instructions (Signed)
You are only mildly anemic at this point. I think taking a prenatal vitamin daily will be enough to get your blood count back to normal. I have ordered up with control to be restarted stop this bleeding.

## 2015-06-16 NOTE — Progress Notes (Signed)
Subjective:    Patient ID: Judy [name], female    DOB: 09/09/1985, 29 y.o.   MRN: [id number] By signing my name below, I, Littie Deeds, attest that this documentation has been prepared under the direction and in the presence of Elvina Sidle, MD.  Electronically Signed: Littie Deeds, Medical Scribe. 06/16/2015. 12:53 PM.  HPI HPI Comments: Judy [name] is a 29 y.o. female who presents to the [facility] complaining of heavy menses for the past 8 days. Patient notes that she passed 2 clots today. She reports having associated fatigue, lightheadedness, and mild abdominal cramping. She normally does not have menstrual periods or only has menstrual periods that last up to 2 days as she is on birth control. Patient notes that she received the Depo-Provera shot for the first time 2 months ago. Previously, she had been taking Micronor which she has not had problems with. She denies fever. She is married.  Patient works as a Pharmacist, hospital.  Review of Systems  Constitutional: Positive for fatigue. Negative for fever.  Gastrointestinal: Positive for abdominal pain.  Genitourinary: Positive for vaginal bleeding and menstrual problem.  Neurological: Positive for light-headedness.       Objective:   Physical Exam CONSTITUTIONAL: Well developed/well nourished HEAD: Normocephalic/atraumatic EYES: EOM/PERRL ENMT: Mucous membranes moist NECK: supple no meningeal signs SPINE: entire spine nontender CV: S1/S2 noted, no murmurs/rubs/gallops noted LUNGS: Lungs are clear to auscultation bilaterally, no apparent distress ABDOMEN: soft, nontender, no rebound or guarding GU: no cva tenderness NEURO: Pt is awake/alert, moves all extremitiesx4 EXTREMITIES: pulses normal, full ROM SKIN: warm, color normal PSYCH: no abnormalities of mood noted  Results for orders placed or performed in visit on 06/16/15  POCT CBC  Result Value Ref Range   WBC 6.3 4.6 - 10.2 K/uL   Lymph, poc 2.4 0.6 - 3.4   POC LYMPH PERCENT 37.6 10 - 50 %L   MID (cbc) 0.6 0 - 0.9   POC MID % 9.3 0 - 12 %M   POC Granulocyte 3.3 2 - 6.9   Granulocyte percent 53.1 37 - 80 %G   RBC 4.17 4.04 - 5.48 M/uL   Hemoglobin 12.3 12.2 - 16.2 g/dL   HCT, POC 01.0 (A) 27.2 - 47.9 %   MCV 87.0 80 - 97 fL   MCH, POC 29.5 27 - 31.2 pg   MCHC 33.9 31.8 - 35.4 g/dL   RDW, POC 53.6 %   Platelet Count, POC 216 142 - 424 K/uL   MPV 7.1 0 - 99.8 fL         Assessment & Plan:   This chart was scribed in my presence and reviewed by me personally.    ICD-9-CM ICD-10-CM   1. Menorrhagia with irregular cycle 626.2 N92.1 POCT CBC     norethindrone (ORTHO MICRONOR) 0.35 MG tablet  2. Encounter for other general counseling or advice on contraception  Z30.09 norethindrone (ORTHO MICRONOR) 0.35 MG tablet  3. Acute blood loss anemia 285.1 D62 Prenatal Vit-Fe Fumarate-FA (PRENATAL MULTIVITAMIN) TABS tablet     Signed, Elvina Sidle, MD

## 2016-07-15 LAB — OB RESULTS CONSOLE HEPATITIS B SURFACE ANTIGEN: Hepatitis B Surface Ag: NEGATIVE

## 2016-07-15 LAB — OB RESULTS CONSOLE ANTIBODY SCREEN: ANTIBODY SCREEN: NEGATIVE

## 2016-07-15 LAB — OB RESULTS CONSOLE GC/CHLAMYDIA
CHLAMYDIA, DNA PROBE: NEGATIVE
GC PROBE AMP, GENITAL: NEGATIVE

## 2016-07-15 LAB — OB RESULTS CONSOLE HIV ANTIBODY (ROUTINE TESTING): HIV: NONREACTIVE

## 2016-07-15 LAB — OB RESULTS CONSOLE RPR: RPR: NONREACTIVE

## 2016-07-15 LAB — OB RESULTS CONSOLE ABO/RH: RH Type: POSITIVE

## 2016-07-15 LAB — OB RESULTS CONSOLE RUBELLA ANTIBODY, IGM: Rubella: IMMUNE

## 2016-09-08 ENCOUNTER — Ambulatory Visit (INDEPENDENT_AMBULATORY_CARE_PROVIDER_SITE_OTHER): Payer: Managed Care, Other (non HMO) | Admitting: Physician Assistant

## 2016-09-08 VITALS — BP 100/60 | HR 79 | Temp 98.6°F | Resp 16 | Ht 64.25 in | Wt 136.8 lb

## 2016-09-08 DIAGNOSIS — J069 Acute upper respiratory infection, unspecified: Secondary | ICD-10-CM

## 2016-09-08 DIAGNOSIS — R070 Pain in throat: Secondary | ICD-10-CM | POA: Diagnosis not present

## 2016-09-08 DIAGNOSIS — B9789 Other viral agents as the cause of diseases classified elsewhere: Secondary | ICD-10-CM

## 2016-09-08 LAB — POCT RAPID STREP A (OFFICE): Rapid Strep A Screen: NEGATIVE

## 2016-09-08 MED ORDER — IPRATROPIUM BROMIDE 0.03 % NA SOLN: 2.0000 | Freq: Two times a day (BID) | NASAL | 0 refills | Status: DC

## 2016-09-08 NOTE — Patient Instructions (Addendum)
This is likely viral. I need you to make sure you are drinking at least 64 oz of water if not more.   I would like you to continue to use tylenol for your pain or fever. The three treatments for the hoarseness is: vocal rest, using a humidifier, and hydration.  Please do these. If you continue to have your symptoms of sore throat hoarseness, fever in the next 6 days--please return.  Also return for the symptoms discussed below.  Upper Respiratory Infection, Adult Most upper respiratory infections (URIs) are caused by a virus. A URI affects the nose, throat, and upper air passages. The most common type of URI is often called "the common cold." Follow these instructions at home:  Take medicines only as told by your doctor.  Gargle warm saltwater or take cough drops to comfort your throat as told by your doctor.  Use a warm mist humidifier or inhale steam from a shower to increase air moisture. This may make it easier to breathe.  Drink enough fluid to keep your pee (urine) clear or pale yellow.  Eat soups and other clear broths.  Have a healthy diet.  Rest as needed.  Go back to work when your fever is gone or your doctor says it is okay.  You may need to stay home longer to avoid giving your URI to others.  You can also wear a face mask and wash your hands often to prevent spread of the virus.  Use your inhaler more if you have asthma.  Do not use any tobacco products, including cigarettes, chewing tobacco, or electronic cigarettes. If you need help quitting, ask your doctor. Contact a doctor if:  You are getting worse, not better.  Your symptoms are not helped by medicine.  You have chills.  You are getting more short of breath.  You have brown or red mucus.  You have yellow or brown discharge from your nose.  You have pain in your face, especially when you bend forward.  You have a fever.  You have puffy (swollen) neck glands.  You have pain while  swallowing.  You have white areas in the back of your throat. Get help right away if:  You have very bad or constant:  Headache.  Ear pain.  Pain in your forehead, behind your eyes, and over your cheekbones (sinus pain).  Chest pain.  You have long-lasting (chronic) lung disease and any of the following:  Wheezing.  Long-lasting cough.  Coughing up blood.  A change in your usual mucus.  You have a stiff neck.  You have changes in your:  Vision.  Hearing.  Thinking.  Mood. This information is not intended to replace advice given to you by your health care provider. Make sure you discuss any questions you have with your health care provider. Document Released: 12/09/2007 Document Revised: 02/23/2016 Document Reviewed: 09/27/2013 Elsevier Interactive Patient Education  2017 [employer]     IF you received an x-ray today, you will receive an invoice from [facility]. Please contact [facility] at [phone] with questions or concerns regarding your invoice.   IF you received labwork today, you will receive an invoice from [facility]. Please contact [facility] at [phone] with questions or concerns regarding your invoice.   Our billing staff will not be able to assist you with questions regarding bills from these companies.  You will be contacted with the lab results as soon as they are available. The fastest way to get your results  is to activate your My Chart account. Instructions are located on the last page of this paperwork. If you have not heard from [place] regarding the results in 2 weeks, please contact this office.

## 2016-09-08 NOTE — Progress Notes (Signed)
[facility] [address], [place] [address] [phone]  Date:  09/08/2016   Name:  Judy [name]   DOB:  1986/04/13   MRN:  [id number]  PCP:  Cassell Clement, MD    History of Present Illness:  Judy [name] is a 31 y.o. gravid female patient who presents to [facility] for chief complaint of hoarseness.  Patient reports 5 days of postnasal drainage, throat pain, and congestion. She has had no coughing, or fever. There is pain with swallowing. She is not hydrating well. She has taken Tylenol for her symptoms. No sinus pain. No ear pain.   Patient Active Problem List   Diagnosis Date Noted  . Postop check 01/08/2014  . Migraine, unspecified, without mention of intractable migraine without mention of status migrainosus 11/22/2012    Past Medical History:  Diagnosis Date  . Migraines   . MVP (mitral valve prolapse)     Past Surgical History:  Procedure Laterality Date  . CESAREAN SECTION    . TONSILLECTOMY AND ADENOIDECTOMY    . WISDOM TOOTH EXTRACTION      Social History  Substance Use Topics  . Smoking status: Never Smoker  . Smokeless tobacco: Never Used  . Alcohol use No     Comment: Rare    No family history on file.  Allergies  Allergen Reactions  . Augmentin [Amoxicillin-Pot Clavulanate] Nausea And Vomiting  . Clindamycin/Lincomycin Rash    Medication list has been reviewed and updated.  Current Outpatient Prescriptions on File Prior to Visit  Medication Sig Dispense Refill  . Prenatal Vit-Fe Fumarate-FA (PRENATAL MULTIVITAMIN) TABS tablet Take 1 tablet by mouth daily at 12 noon. 30 tablet 0  . medroxyPROGESTERone (DEPO-PROVERA) 150 MG/ML injection Inject 150 mg into the muscle every 3 (three) months.    . norethindrone (ORTHO MICRONOR) 0.35 MG tablet Take 1 tablet (0.35 mg total) by mouth daily. (Patient not taking: Reported on 09/08/2016) 1 Package 11   No current facility-administered medications on file prior to visit.      ROS ROS otherwise unremarkable unless listed above.  Physical Examination: BP 100/60 (BP Location: Right Arm, Patient Position: Sitting, Cuff Size: Normal)   Pulse 79   Temp 98.6 F (37 C) (Oral)   Resp 16   Ht 5' 4.25" (1.632 m)   Wt 136 lb 12.8 oz (62.1 kg)   SpO2 100%   BMI 23.30 kg/m  Ideal Body Weight: Weight in (lb) to have BMI = 25: 146.5  Physical Exam  Constitutional: She is oriented to person, place, and time. She appears well-developed and well-nourished. No distress.  HENT:  Head: Normocephalic and atraumatic.  Right Ear: Tympanic membrane, external ear and ear canal normal.  Left Ear: Tympanic membrane, external ear and ear canal normal.  Nose: Mucosal edema and rhinorrhea present. Right sinus exhibits no maxillary sinus tenderness and no frontal sinus tenderness. Left sinus exhibits no maxillary sinus tenderness and no frontal sinus tenderness.  Mouth/Throat: No uvula swelling. Posterior oropharyngeal erythema (Very mild erythema.) present. No oropharyngeal exudate or posterior oropharyngeal edema.  Eyes: Conjunctivae and EOM are normal. Pupils are equal, round, and reactive to light.  Cardiovascular: Normal rate and regular rhythm.  Exam reveals no gallop, no distant heart sounds and no friction rub.   No murmur heard. Pulmonary/Chest: Effort normal. No respiratory distress. She has no decreased breath sounds. She has no wheezes. She has no rhonchi.  Lymphadenopathy:       Head (right side): No  submandibular, no tonsillar, no preauricular and no posterior auricular adenopathy present.       Head (left side): No submandibular, no tonsillar, no preauricular and no posterior auricular adenopathy present.  Neurological: She is alert and oriented to person, place, and time.  Skin: She is not diaphoretic.  Psychiatric: She has a normal mood and affect. Her behavior is normal.     Assessment and Plan: Judy [name] is a 31 y.o. female who is here today for  upper respiratory symptoms. This appears viral. Treating supportively. Advised ipratropium nasal spray, supple call throat lozenges and Tylenol. Also advised increasing of hydration. She will also use a humidifier, and local rest. She'll contact me if her symptoms do not improve within 60s.  Viral URI - Plan: Cetirizine HCl (ZYRTEC PO), POCT rapid strep A, ipratropium (ATROVENT) 0.03 % nasal spray  Throat pain - Plan: POCT rapid strep A  Judy PlattStephanie Crue Otero, PA-C [facility] 3/6/20183:40 PM

## 2016-09-19 ENCOUNTER — Ambulatory Visit (INDEPENDENT_AMBULATORY_CARE_PROVIDER_SITE_OTHER): Payer: Managed Care, Other (non HMO) | Admitting: Physician Assistant

## 2016-09-19 VITALS — BP 108/58 | HR 82 | Temp 98.4°F | Resp 18 | Ht 65.0 in | Wt 139.8 lb

## 2016-09-19 DIAGNOSIS — Z9109 Other allergy status, other than to drugs and biological substances: Secondary | ICD-10-CM | POA: Diagnosis not present

## 2016-09-19 DIAGNOSIS — J3489 Other specified disorders of nose and nasal sinuses: Secondary | ICD-10-CM | POA: Diagnosis not present

## 2016-09-19 MED ORDER — GUAIFENESIN ER 1200 MG PO TB12: 1.0000 | ORAL_TABLET | Freq: Two times a day (BID) | ORAL | 0 refills | Status: AC

## 2016-09-19 MED ORDER — AMOXICILLIN 875 MG PO TABS: 875.0000 mg | ORAL_TABLET | Freq: Two times a day (BID) | ORAL | 0 refills | Status: AC

## 2016-09-19 NOTE — Patient Instructions (Addendum)
Please push fluids.  Tylenol for fever and body aches.    A humidifier can help especially when the air is dry -if you do not have a humidifier you can boil a pot of water on the stove in your home to help with the dry air.  Nasal saline spray can be helpful to keep the mucus membranes moist and thin the nasal mucus  Start mucinex and zyrtec to help with sinus congestion - fill antibiotic if you are not improving in the next 3-5 days   IF you received an x-ray today, you will receive an invoice from [facility]. Please contact [facility] at [phone] with questions or concerns regarding your invoice.   IF you received labwork today, you will receive an invoice from [facility]. Please contact [facility] at [phone] with questions or concerns regarding your invoice.   Our billing staff will not be able to assist you with questions regarding bills from these companies.  You will be contacted with the lab results as soon as they are available. The fastest way to get your results is to activate your My Chart account. Instructions are located on the last page of this paperwork. If you have not heard from us regarding the results in 2 weeks, please contact this office.

## 2016-09-19 NOTE — Progress Notes (Signed)
Judy [name]  MRN: [id number] DOB: 03/27/1986  PCP: No PCP Per Patient  Chief Complaint  Patient presents with  . Cough    X Tuesday  . Nasal Congestion    X Tuesday  . Shortness of Breath    X Tuesday  . Headache    X Tueday  . Follow-up    Not feeling any better since last visit    Subjective:  Pt presents to clinic for recheck - when she was seen on 09/08/2016 she was diagnosed with a viral illness but over the last 4-5 days her symptoms have changed.  She now has a cough which is dry (am production) like from a tickle in her throat/upper chest. She is having some SOB and chest tightness - seems more winded than normal. She has been using robutussin a few days and tylenol for the headache.  The headache feels like sinus pressure behind her nose.  She is sleeping ok at night but she is always tired.  She tried zyrtec at the beginning of the illness and but has not over the last couple of days.  [redacted] weeks pregnant  Review of Systems  Constitutional: Negative for chills and fever.  HENT: Positive for congestion (better), postnasal drip (mild) and sinus pressure. Negative for rhinorrhea, sore throat (resolved) and voice change (resolved).   Respiratory: Positive for cough and shortness of breath (winded). Negative for wheezing.        No h/o asthma, nonsmoker  Gastrointestinal: Negative.   Allergic/Immunologic: Positive for environmental allergies (typical problems in the spring - unsure if that is what she is dealing with).  Neurological: Positive for headaches. Negative for dizziness.    Patient Active Problem List   Diagnosis Date Noted  . Pregnancy 08/02/2013  . Migraine, unspecified, without mention of intractable migraine without mention of status migrainosus 11/22/2012  . Mitral regurgitation 11/11/2011  . Patent foramen ovale 11/11/2011    Current Outpatient Prescriptions on File Prior to Visit  Medication Sig Dispense Refill  . Prenatal Vit-Fe Fumarate-FA  (PRENATAL MULTIVITAMIN) TABS tablet Take 1 tablet by mouth daily at 12 noon. 30 tablet 0  . Cetirizine HCl (ZYRTEC PO) Take by mouth daily.     No current facility-administered medications on file prior to visit.     Allergies  Allergen Reactions  . Augmentin [Amoxicillin-Pot Clavulanate] Nausea And Vomiting  . Clindamycin/Lincomycin Rash    Pt patients past, family and social history were reviewed and updated.   Objective:  BP (!) 108/58   Pulse 82   Temp 98.4 F (36.9 C) (Oral)   Resp 18   Ht 5\' 5"  (1.651 m)   Wt 139 lb 12.8 oz (63.4 kg)   SpO2 98%   BMI 23.26 kg/m   Physical Exam  Constitutional: She is oriented to person, place, and time and well-developed, well-nourished, and in no distress.  HENT:  Head: Normocephalic and atraumatic.  Right Ear: Hearing, tympanic membrane, external ear and ear canal normal.  Left Ear: Hearing, tympanic membrane, external ear and ear canal normal.  Nose: Mucosal edema (pale) present. Right sinus exhibits no maxillary sinus tenderness and no frontal sinus tenderness. Left sinus exhibits no maxillary sinus tenderness and no frontal sinus tenderness.  Mouth/Throat: Uvula is midline, oropharynx is clear and moist and mucous membranes are normal.  Eyes: Conjunctivae are normal.  Neck: Normal range of motion.  Cardiovascular: Normal rate, regular rhythm and normal heart sounds.   No murmur heard. Pulmonary/Chest: Effort normal and  breath sounds normal.  Neurological: She is alert and oriented to person, place, and time. Gait normal.  Skin: Skin is warm and dry.  Psychiatric: Mood, memory, affect and judgment normal.  Vitals reviewed.   Assessment and Plan :  Sinus pressure - Plan: Guaifenesin (MUCINEX MAXIMUM STRENGTH) 1200 MG TB12, amoxicillin (AMOXIL) 875 MG tablet  Environmental allergies   I suspect pt has recovered from her URI but with her added allergies she might have an early sinus infection - she will try symptomatic  treatment and then if she is not improving she will start the amoxicillin (she tolerates this well).  Benny Lennert PA-C  Primary Care at [facility] 09/19/2016 10:20 AM

## 2017-01-12 ENCOUNTER — Other Ambulatory Visit: Payer: Self-pay | Admitting: Obstetrics

## 2017-01-22 ENCOUNTER — Telehealth (HOSPITAL_COMMUNITY): Payer: Self-pay | Admitting: *Deleted

## 2017-01-22 ENCOUNTER — Encounter (HOSPITAL_COMMUNITY): Payer: Self-pay | Admitting: *Deleted

## 2017-01-22 NOTE — Telephone Encounter (Signed)
Preadmission screen  

## 2017-01-25 ENCOUNTER — Encounter (HOSPITAL_COMMUNITY): Payer: Self-pay

## 2017-02-01 ENCOUNTER — Encounter (HOSPITAL_COMMUNITY)
Admission: RE | Admit: 2017-02-01 | Discharge: 2017-02-01 | Disposition: A | Discharge status: Home / Self Care | Payer: Managed Care, Other (non HMO) | Source: Ambulatory Visit | Attending: Obstetrics | Admitting: Obstetrics

## 2017-02-01 HISTORY — DX: Gastro-esophageal reflux disease without esophagitis: K21.9

## 2017-02-01 HISTORY — DX: Anemia, unspecified: D64.9

## 2017-02-01 LAB — TYPE AND SCREEN
ABO/RH(D): O POS
ANTIBODY SCREEN: NEGATIVE

## 2017-02-01 LAB — CBC
HCT: 36.7 % (ref 36.0–46.0)
Hemoglobin: 11.8 g/dL — ABNORMAL LOW (ref 12.0–15.0)
MCH: 31 pg (ref 26.0–34.0)
MCHC: 32.2 g/dL (ref 30.0–36.0)
MCV: 96.3 fL (ref 78.0–100.0)
PLATELETS: 172 10*3/uL (ref 150–400)
RBC: 3.81 MIL/uL — AB (ref 3.87–5.11)
RDW: 15 % (ref 11.5–15.5)
WBC: 9.7 10*3/uL (ref 4.0–10.5)

## 2017-02-01 LAB — ABO/RH: ABO/RH(D): O POS

## 2017-02-01 NOTE — Anesthesia Preprocedure Evaluation (Signed)
Anesthesia Evaluation  Patient identified by MRN, date of birth, ID band Patient awake    Reviewed: Allergy & Precautions, H&P , Patient's Chart, lab work & pertinent test results  Airway Mallampati: II  TM Distance: >3 FB Neck ROM: full    Dental no notable dental hx.    Pulmonary    Pulmonary exam normal breath sounds clear to auscultation       Cardiovascular Exercise Tolerance: Good  Rhythm:regular Rate:Normal     Neuro/Psych    GI/Hepatic   Endo/Other    Renal/GU      Musculoskeletal   Abdominal   Peds  Hematology   Anesthesia Other Findings   Reproductive/Obstetrics                             Anesthesia Physical Anesthesia Plan  ASA: II  Anesthesia Plan: Spinal   Post-op Pain Management:    Induction:   PONV Risk Score and Plan:   Airway Management Planned:   Additional Equipment:   Intra-op Plan:   Post-operative Plan:   Informed Consent: I have reviewed the patients History and Physical, chart, labs and discussed the procedure including the risks, benefits and alternatives for the proposed anesthesia with the patient or authorized representative who has indicated his/her understanding and acceptance.     Plan Discussed with:   Anesthesia Plan Comments: (  )        Anesthesia Quick Evaluation  

## 2017-02-01 NOTE — Patient Instructions (Signed)
20 Lorayne BenderLindsay D Kadel  02/01/2017   Your procedure is scheduled on:  01/23/2017  [facility] at 0530 AM.  Pick up the phone at the desk and dial [phone].   Call this number if you have problems the morning of surgery: [phone]   Remember:   Do not eat food:After Midnight.  Do not drink clear liquids: After Midnight.  Take these medicines the morning of surgery with A SIP OF WATER: none   Do not wear jewelry, make-up or nail polish.  Do not wear lotions, powders, or perfumes. Do not wear deodorant.  Do not shave 48 hours prior to surgery.  Do not bring valuables to the hospital.  [facility] is not   responsible for any belongings or valuables brought to the hospital.  Contacts, dentures or bridgework may not be worn into surgery.  Leave suitcase in the car. After surgery it may be brought to your room.  For patients admitted to the hospital, checkout time is 11:00 AM the day of              discharge.   Patients discharged the day of surgery will not be allowed to drive             home.  Name and phone number of your driver: na  Special Instructions:   N/A   Please read over the following fact sheets that you were given:   Surgical Site Infection Prevention

## 2017-02-02 ENCOUNTER — Encounter (HOSPITAL_COMMUNITY): Payer: Self-pay

## 2017-02-02 ENCOUNTER — Inpatient Hospital Stay (HOSPITAL_COMMUNITY): Payer: Managed Care, Other (non HMO) | Admitting: Certified Registered Nurse Anesthetist

## 2017-02-02 ENCOUNTER — Inpatient Hospital Stay (HOSPITAL_COMMUNITY)
Admission: RE | Admit: 2017-02-02 | Discharge: 2017-02-04 | DRG: 765 | Disposition: A | Discharge status: Home / Self Care | Payer: Managed Care, Other (non HMO) | Source: Ambulatory Visit | Attending: Obstetrics | Admitting: Obstetrics

## 2017-02-02 ENCOUNTER — Encounter (HOSPITAL_COMMUNITY)
Admission: RE | Disposition: A | Discharge status: Home / Self Care | Payer: Self-pay | Source: Ambulatory Visit | Attending: Obstetrics

## 2017-02-02 DIAGNOSIS — G971 Other reaction to spinal and lumbar puncture: Secondary | ICD-10-CM | POA: Diagnosis not present

## 2017-02-02 DIAGNOSIS — O9962 Diseases of the digestive system complicating childbirth: Secondary | ICD-10-CM | POA: Diagnosis present

## 2017-02-02 DIAGNOSIS — Z3A39 39 weeks gestation of pregnancy: Secondary | ICD-10-CM

## 2017-02-02 DIAGNOSIS — D62 Acute posthemorrhagic anemia: Secondary | ICD-10-CM | POA: Diagnosis present

## 2017-02-02 DIAGNOSIS — K219 Gastro-esophageal reflux disease without esophagitis: Secondary | ICD-10-CM | POA: Diagnosis present

## 2017-02-02 DIAGNOSIS — O34211 Maternal care for low transverse scar from previous cesarean delivery: Secondary | ICD-10-CM | POA: Diagnosis present

## 2017-02-02 DIAGNOSIS — O894 Spinal and epidural anesthesia-induced headache during the puerperium: Secondary | ICD-10-CM | POA: Diagnosis not present

## 2017-02-02 DIAGNOSIS — Z880 Allergy status to penicillin: Secondary | ICD-10-CM | POA: Diagnosis not present

## 2017-02-02 DIAGNOSIS — O9081 Anemia of the puerperium: Secondary | ICD-10-CM | POA: Diagnosis present

## 2017-02-02 DIAGNOSIS — Z98891 History of uterine scar from previous surgery: Secondary | ICD-10-CM

## 2017-02-02 LAB — RPR: RPR Ser Ql: NONREACTIVE

## 2017-02-02 SURGERY — Surgical Case
Anesthesia: Spinal

## 2017-02-02 MED ORDER — SENNOSIDES-DOCUSATE SODIUM 8.6-50 MG PO TABS
2.0000 | ORAL_TABLET | ORAL | Status: DC
  Administered 2017-02-02 – 2017-02-03 (×2): 2 via ORAL
  Filled 2017-02-02 (×2): qty 2

## 2017-02-02 MED ORDER — IBUPROFEN 600 MG PO TABS
600.0000 mg | ORAL_TABLET | Freq: Four times a day (QID) | ORAL | Status: DC
  Administered 2017-02-02 – 2017-02-04 (×8): 600 mg via ORAL
  Filled 2017-02-02 (×8): qty 1

## 2017-02-02 MED ORDER — ACETAMINOPHEN 325 MG PO TABS
650.0000 mg | ORAL_TABLET | ORAL | Status: DC | PRN
  Administered 2017-02-03 – 2017-02-04 (×4): 650 mg via ORAL
  Filled 2017-02-02 (×5): qty 2

## 2017-02-02 MED ORDER — MORPHINE SULFATE (PF) 0.5 MG/ML IJ SOLN
INTRAMUSCULAR | Status: DC | PRN
Start: 2017-02-02 — End: 2017-02-02
  Administered 2017-02-02: .1 mg via INTRATHECAL

## 2017-02-02 MED ORDER — CEFAZOLIN SODIUM-DEXTROSE 2-4 GM/100ML-% IV SOLN
2.0000 g | INTRAVENOUS | Status: AC
  Administered 2017-02-02: 2 g via INTRAVENOUS

## 2017-02-02 MED ORDER — SCOPOLAMINE 1 MG/3DAYS TD PT72
MEDICATED_PATCH | TRANSDERMAL | Status: AC
  Filled 2017-02-02: qty 1

## 2017-02-02 MED ORDER — PHENYLEPHRINE 8 MG IN D5W 100 ML (0.08MG/ML) PREMIX OPTIME
INJECTION | INTRAVENOUS | Status: AC
  Filled 2017-02-02: qty 100

## 2017-02-02 MED ORDER — TETANUS-DIPHTH-ACELL PERTUSSIS 5-2.5-18.5 LF-MCG/0.5 IM SUSP: 0.5000 mL | Freq: Once | INTRAMUSCULAR | Status: DC

## 2017-02-02 MED ORDER — PRENATAL MULTIVITAMIN CH
1.0000 | ORAL_TABLET | Freq: Every day | ORAL | Status: DC
  Administered 2017-02-03 – 2017-02-04 (×2): 1 via ORAL
  Filled 2017-02-02 (×2): qty 1

## 2017-02-02 MED ORDER — SIMETHICONE 80 MG PO CHEW
80.0000 mg | CHEWABLE_TABLET | Freq: Three times a day (TID) | ORAL | Status: DC
Start: 2017-02-02 — End: 2017-02-04
  Administered 2017-02-02 – 2017-02-04 (×6): 80 mg via ORAL
  Filled 2017-02-02 (×6): qty 1

## 2017-02-02 MED ORDER — MENTHOL 3 MG MT LOZG: 1.0000 | LOZENGE | OROMUCOSAL | Status: DC | PRN

## 2017-02-02 MED ORDER — DIBUCAINE 1 % RE OINT: 1.0000 "application " | TOPICAL_OINTMENT | RECTAL | Status: DC | PRN

## 2017-02-02 MED ORDER — LACTATED RINGERS IV SOLN
INTRAVENOUS | Status: DC
  Administered 2017-02-02: 16:00:00 via INTRAVENOUS

## 2017-02-02 MED ORDER — DEXAMETHASONE SODIUM PHOSPHATE 10 MG/ML IJ SOLN
INTRAMUSCULAR | Status: AC
Start: 2017-02-02 — End: 2017-02-02
  Filled 2017-02-02: qty 1

## 2017-02-02 MED ORDER — BUPIVACAINE IN DEXTROSE 0.75-8.25 % IT SOLN
INTRATHECAL | Status: AC
  Filled 2017-02-02: qty 2

## 2017-02-02 MED ORDER — FENTANYL CITRATE (PF) 100 MCG/2ML IJ SOLN
INTRAMUSCULAR | Status: DC | PRN
  Administered 2017-02-02: 25 ug via INTRATHECAL

## 2017-02-02 MED ORDER — FENTANYL CITRATE (PF) 100 MCG/2ML IJ SOLN
INTRAMUSCULAR | Status: AC
  Filled 2017-02-02: qty 2

## 2017-02-02 MED ORDER — PHENYLEPHRINE HCL 10 MG/ML IJ SOLN
INTRAMUSCULAR | Status: DC | PRN
  Administered 2017-02-02 (×2): 80 ug via INTRAVENOUS
  Administered 2017-02-02: 40 ug via INTRAVENOUS

## 2017-02-02 MED ORDER — ZOLPIDEM TARTRATE 5 MG PO TABS: 5.0000 mg | ORAL_TABLET | Freq: Every evening | ORAL | Status: DC | PRN

## 2017-02-02 MED ORDER — ONDANSETRON HCL 4 MG/2ML IJ SOLN
INTRAMUSCULAR | Status: DC | PRN
  Administered 2017-02-02: 4 mg via INTRAVENOUS

## 2017-02-02 MED ORDER — PHENYLEPHRINE 8 MG IN D5W 100 ML (0.08MG/ML) PREMIX OPTIME
INJECTION | INTRAVENOUS | Status: DC | PRN
  Administered 2017-02-02: 60 ug/min via INTRAVENOUS

## 2017-02-02 MED ORDER — OXYTOCIN 10 UNIT/ML IJ SOLN
INTRAVENOUS | Status: DC | PRN
  Administered 2017-02-02: 40 [IU] via INTRAVENOUS

## 2017-02-02 MED ORDER — LACTATED RINGERS IV SOLN
INTRAVENOUS | Status: DC
  Administered 2017-02-02 (×4): via INTRAVENOUS

## 2017-02-02 MED ORDER — ONDANSETRON HCL 4 MG/2ML IJ SOLN
INTRAMUSCULAR | Status: AC
  Filled 2017-02-02: qty 2

## 2017-02-02 MED ORDER — ACETAMINOPHEN 500 MG PO TABS
1000.0000 mg | ORAL_TABLET | Freq: Once | ORAL | Status: AC
  Administered 2017-02-02: 1000 mg via ORAL
  Filled 2017-02-02: qty 2

## 2017-02-02 MED ORDER — SIMETHICONE 80 MG PO CHEW: 80.0000 mg | CHEWABLE_TABLET | ORAL | Status: DC | PRN

## 2017-02-02 MED ORDER — COCONUT OIL OIL: 1.0000 "application " | TOPICAL_OIL | Status: DC | PRN

## 2017-02-02 MED ORDER — OXYTOCIN 40 UNITS IN LACTATED RINGERS INFUSION - SIMPLE MED: 2.5000 [IU]/h | INTRAVENOUS | Status: DC

## 2017-02-02 MED ORDER — FENTANYL CITRATE (PF) 100 MCG/2ML IJ SOLN: 25.0000 ug | INTRAMUSCULAR | Status: DC | PRN

## 2017-02-02 MED ORDER — MORPHINE SULFATE (PF) 0.5 MG/ML IJ SOLN
INTRAMUSCULAR | Status: AC
  Filled 2017-02-02: qty 10

## 2017-02-02 MED ORDER — DEXAMETHASONE SODIUM PHOSPHATE 4 MG/ML IJ SOLN
INTRAMUSCULAR | Status: AC
  Filled 2017-02-02: qty 1

## 2017-02-02 MED ORDER — DIPHENHYDRAMINE HCL 25 MG PO CAPS: 25.0000 mg | ORAL_CAPSULE | Freq: Four times a day (QID) | ORAL | Status: DC | PRN

## 2017-02-02 MED ORDER — SODIUM CHLORIDE 0.9 % IR SOLN
Status: DC | PRN
  Administered 2017-02-02: 75 mL

## 2017-02-02 MED ORDER — SIMETHICONE 80 MG PO CHEW
80.0000 mg | CHEWABLE_TABLET | ORAL | Status: DC
  Administered 2017-02-02 – 2017-02-03 (×2): 80 mg via ORAL
  Filled 2017-02-02 (×2): qty 1

## 2017-02-02 MED ORDER — BUPIVACAINE IN DEXTROSE 0.75-8.25 % IT SOLN
INTRATHECAL | Status: DC | PRN
  Administered 2017-02-02: 1.4 mL via INTRATHECAL

## 2017-02-02 MED ORDER — PHENYLEPHRINE 40 MCG/ML (10ML) SYRINGE FOR IV PUSH (FOR BLOOD PRESSURE SUPPORT)
PREFILLED_SYRINGE | INTRAVENOUS | Status: AC
  Filled 2017-02-02: qty 10

## 2017-02-02 MED ORDER — OXYTOCIN 10 UNIT/ML IJ SOLN
INTRAMUSCULAR | Status: AC
  Filled 2017-02-02: qty 4

## 2017-02-02 MED ORDER — SCOPOLAMINE 1 MG/3DAYS TD PT72
MEDICATED_PATCH | TRANSDERMAL | Status: DC | PRN
  Administered 2017-02-02: 1 via TRANSDERMAL

## 2017-02-02 MED ORDER — WITCH HAZEL-GLYCERIN EX PADS: 1.0000 "application " | MEDICATED_PAD | CUTANEOUS | Status: DC | PRN

## 2017-02-02 MED ORDER — DEXAMETHASONE SODIUM PHOSPHATE 4 MG/ML IJ SOLN
INTRAMUSCULAR | Status: DC | PRN
  Administered 2017-02-02: 10 mg via INTRAVENOUS

## 2017-02-02 MED ORDER — LACTATED RINGERS IV SOLN
INTRAVENOUS | Status: DC | PRN
  Administered 2017-02-02: 08:00:00 via INTRAVENOUS

## 2017-02-02 SURGICAL SUPPLY — 42 items
APL SKNCLS STERI-STRIP NONHPOA (GAUZE/BANDAGES/DRESSINGS) ×1
BENZOIN TINCTURE PRP APPL 2/3 (GAUZE/BANDAGES/DRESSINGS) ×2 IMPLANT
CHLORAPREP W/TINT 26ML (MISCELLANEOUS) ×3 IMPLANT
CLAMP CORD UMBIL (MISCELLANEOUS) ×2 IMPLANT
CLOSURE WOUND 1/2 X4 (GAUZE/BANDAGES/DRESSINGS) ×1
CLOTH BEACON ORANGE TIMEOUT ST (SAFETY) ×3 IMPLANT
CONTAINER PREFILL 10% NBF 15ML (MISCELLANEOUS) IMPLANT
DRSG OPSITE POSTOP 4X10 (GAUZE/BANDAGES/DRESSINGS) ×3 IMPLANT
ELECT REM PT RETURN 9FT ADLT (ELECTROSURGICAL) ×3
ELECTRODE REM PT RTRN 9FT ADLT (ELECTROSURGICAL) ×1 IMPLANT
EXTRACTOR VACUUM M CUP 4 TUBE (SUCTIONS) IMPLANT
EXTRACTOR VACUUM M CUP 4' TUBE (SUCTIONS)
GLOVE BIO SURGEON STRL SZ 6.5 (GLOVE) ×3 IMPLANT
GLOVE BIO SURGEONS STRL SZ 6.5 (GLOVE) ×2
GLOVE BIOGEL PI IND STRL 7.0 (GLOVE) ×2 IMPLANT
GLOVE BIOGEL PI INDICATOR 7.0 (GLOVE) ×6
GOWN STRL REUS W/TWL LRG LVL3 (GOWN DISPOSABLE) ×6 IMPLANT
HEMOSTAT ARISTA ABSORB 3G PWDR (MISCELLANEOUS) ×2 IMPLANT
KIT ABG SYR 3ML LUER SLIP (SYRINGE) IMPLANT
NDL HYPO 25X5/8 SAFETYGLIDE (NEEDLE) IMPLANT
NEEDLE HYPO 22GX1.5 SAFETY (NEEDLE) IMPLANT
NEEDLE HYPO 25X5/8 SAFETYGLIDE (NEEDLE) IMPLANT
NS IRRIG 1000ML POUR BTL (IV SOLUTION) ×3 IMPLANT
PACK C SECTION WH (CUSTOM PROCEDURE TRAY) ×3 IMPLANT
PAD ABD 7.5X8 STRL (GAUZE/BANDAGES/DRESSINGS) ×2 IMPLANT
PAD OB MATERNITY 4.3X12.25 (PERSONAL CARE ITEMS) ×3 IMPLANT
PENCIL SMOKE EVAC W/HOLSTER (ELECTROSURGICAL) ×3 IMPLANT
SPONGE GAUZE 4X4 12PLY STER LF (GAUZE/BANDAGES/DRESSINGS) ×4 IMPLANT
STRIP CLOSURE SKIN 1/2X4 (GAUZE/BANDAGES/DRESSINGS) ×1 IMPLANT
SUT MON AB 4-0 PS1 27 (SUTURE) ×3 IMPLANT
SUT PLAIN 0 NONE (SUTURE) IMPLANT
SUT PLAIN 2 0 (SUTURE) ×3
SUT PLAIN 2 0 XLH (SUTURE) IMPLANT
SUT PLAIN ABS 2-0 CT1 27XMFL (SUTURE) IMPLANT
SUT VIC AB 0 CT1 36 (SUTURE) ×6 IMPLANT
SUT VIC AB 0 CTX 36 (SUTURE) ×6
SUT VIC AB 0 CTX36XBRD ANBCTRL (SUTURE) ×2 IMPLANT
SUT VIC AB 2-0 CT1 27 (SUTURE) ×3
SUT VIC AB 2-0 CT1 TAPERPNT 27 (SUTURE) ×1 IMPLANT
SYR CONTROL 10ML LL (SYRINGE) IMPLANT
TOWEL OR 17X24 6PK STRL BLUE (TOWEL DISPOSABLE) ×3 IMPLANT
TRAY FOLEY BAG SILVER LF 14FR (SET/KITS/TRAYS/PACK) ×2 IMPLANT

## 2017-02-02 NOTE — Transfer of Care (Signed)
Immediate Anesthesia Transfer of Care Note  Patient: Judy BenderLindsay D [name]  Procedure(s) Performed: Procedure(s) with comments: Repeat CESAREAN SECTION (N/A) - EDD: 02/09/17 Allergy: Augmentin, Clindamycin  Patient Location: PACU  Anesthesia Type:Spinal  Level of Consciousness: awake, alert  and oriented  Airway & Oxygen Therapy: Patient Spontanous Breathing  Post-op Assessment: Report given to RN and Post -op Vital signs reviewed and stable  Post vital signs: Reviewed and stable  Last Vitals:  Vitals:   02/02/17 0550  BP: 109/64  Pulse: 71  Temp: 36.8 C    Last Pain:  Vitals:   02/02/17 0550  TempSrc: Oral         Complications: No apparent anesthesia complications

## 2017-02-02 NOTE — Brief Op Note (Signed)
02/02/2017  8:45 AM  PATIENT:  Judy BenderLindsay D [name]  31 y.o. female  PRE-OPERATIVE DIAGNOSIS:  Previous Cesarean Section  POST-OPERATIVE DIAGNOSIS:  Previous Cesarean Section  PROCEDURE:  Procedure(s) with comments: Repeat CESAREAN SECTION (N/A) - EDD: 02/09/17 Allergy: Augmentin, Clindamycin  LTCS with 2 layer closure Scar revision  SURGEON:  Surgeon(s) and Role:    Noland Fordyce* Tangelia Sanson, MD - Primary  PHYSICIAN ASSISTANT:   ASSISTANTSFredric Mare: Bailey, CNM   ANESTHESIA:   spinal  EBL:  Total I/O In: 3000 [I.V.:3000] Out: 1040 [Urine:100; Other:250; Blood:690]  BLOOD ADMINISTERED:none  DRAINS: Urinary Catheter (Foley)   LOCAL MEDICATIONS USED:  NONE  SPECIMEN:  Source of Specimen:  placenta  DISPOSITION OF SPECIMEN:  L&D  COUNTS:  YES  TOURNIQUET:  * No tourniquets in log *  DICTATION: .Note written in EPIC  PLAN OF CARE: Admit to inpatient   PATIENT DISPOSITION:  PACU - hemodynamically stable.   Delay start of Pharmacological VTE agent (>24hrs) due to surgical blood loss or risk of bleeding: yes

## 2017-02-02 NOTE — Op Note (Signed)
02/02/2017  8:45 AM  PATIENT:  Judy BenderLindsay D [name]  31 y.o. female  PRE-OPERATIVE DIAGNOSIS:  Previous Cesarean Section  POST-OPERATIVE DIAGNOSIS:  Previous Cesarean Section  PROCEDURE:  Procedure(s) with comments: Repeat CESAREAN SECTION (N/A) - EDD: 02/09/17 Allergy: Augmentin, Clindamycin  LTCS with 2 layer closure Scar revision  SURGEON:  Surgeon(s) and Role:    Noland Fordyce* Shaylee Stanislawski, MD - Primary  PHYSICIAN ASSISTANT:   ASSISTANTSFredric Mare: Bailey, CNM   ANESTHESIA:   spinal  EBL:  Total I/O In: 3000 [I.V.:3000] Out: 1040 [Urine:100; Other:250; Blood:690]  BLOOD ADMINISTERED:none  DRAINS: Urinary Catheter (Foley)   LOCAL MEDICATIONS USED:  NONE  SPECIMEN:  Source of Specimen:  placenta  DISPOSITION OF SPECIMEN:  L&D  COUNTS:  YES  TOURNIQUET:  * No tourniquets in log *  DICTATION: .Note written in EPIC  PLAN OF CARE: Admit to inpatient   PATIENT DISPOSITION:  PACU - hemodynamically stable.   Delay start of Pharmacological VTE agent (>24hrs) due to surgical blood loss or risk of bleeding: yes     Findings:  @BABYSEXEBC @ infant,  APGAR (1 MIN): 9   APGAR (5 MINS): 9   APGAR (10 MINS):   Normal uterus, tubes and ovaries, normal placenta. 3VC, clear amniotic fluid  EBL: 690 cc Antibiotics:   2g Ancef Complications: none  Indications: This is a 31 y.o. year-old, G2P1  At 3315w0d admitted for RCS. Risks benefits and alternatives of the procedure were discussed with the patient who agreed to proceed  Procedure:  After informed consent was obtained the patient was taken to the operating room where spinal anesthesia was initiated.  She was prepped and draped in the normal sterile fashion in dorsal supine position with a leftward tilt.  A foley catheter was in place. The prior incision was evaluated and due to the thickness, the patient was consented for a scar revision.  An elliptical  skin incision was made over the prior Pfannenstiel incision with the scalpel. The epidermis  was removed sharply. Dissection was carried down with the Bovie cautery until the fascia was reached. The fascia was incised in the midline. The incision was extended laterally with the Mayo scissors. The inferior aspect of the fascial incision was grasped with the Coker clamps, elevated up and the underlying rectus muscles were dissected off sharply. The superior aspect of the fascial incision was grasped with the Coker clamps elevated up and the underlying rectus muscles were dissected off sharply.  The peritoneum was entered during the fascial dissection due to prior adhesions.  The peritoneal incision was extended superiorly and inferiorly with good visualization of the bladder. The bladder blade was inserted and palpation was done to assess the fetal position and the location of the uterine vessels. A bladder flap was created sharply. The lower segment of the uterus was incised sharply with the scalpel and extended  bluntly in the cephalo-caudal fashion. The infant was grasped, brought to the incision,  rotated and the infant was delivered with fundal pressure. The nose and mouth were bulb suctioned. The cord was clamped and cut after 1 minute delay. The infant was handed off to the waiting pediatrician. The placenta was expressed. The uterus was exteriorized. The uterus was cleared of all clots and debris. The uterine incision was repaired with 0 Vicryl in a running locked fashion.  A second layer of the same suture was used in an imbricating fashion to obtain excellent hemostasis. Several additional figure of 8 sutures were placed. Still oozing was noted and  bovie cautery was done on the serosal edge. Additionl bleeding was noted from the bladder flap and this was controlled with Arista powder.  The uterus was then returned to the abdomen, the gutters were cleared of all clots and debris. The uterine incision was reinspected and found to be hemostatic. The peritoneum was grasped and closed with 2-0 Vicryl in  a running fashion. The cut muscle edges and the underside of the fascia were inspected and found to be hemostatic. The fascia was closed with 0 Vicryl in a single layer . The subcutaneous tissue was irrigated. Scarpa's layer was closed with a 2-0 plain gut suture. The skin was closed with a 4-0 Monocryl in a single layer. The patient tolerated the procedure well. Sponge lap and needle counts were correct x3 and patient was taken to the recovery room in a stable condition.  Kiyoto Slomski A. 02/02/2017 8:46 AM

## 2017-02-02 NOTE — Anesthesia Procedure Notes (Signed)
Spinal  Patient location during procedure: OR Staffing Anesthesiologist: Wayman Hoard Preanesthetic Checklist Completed: patient identified, site marked, surgical consent, pre-op evaluation, timeout performed, IV checked, risks and benefits discussed and monitors and equipment checked Spinal Block Patient position: sitting Prep: DuraPrep Patient monitoring: heart rate, cardiac monitor, continuous pulse ox and blood pressure Approach: midline Location: L3-4 Injection technique: single-shot Needle Needle type: Sprotte  Needle gauge: 24 G Needle length: 9 cm Assessment Sensory level: T4 Additional Notes Spinal Dosage in OR  .75% Bupivicaine ml       1.4     [id number]   mcg        100    Fentanyl mcg            25      

## 2017-02-02 NOTE — H&P (Signed)
Judy [name] is a 31 y.o. G2P1001 at 10251w0d presenting for RCS. Pt notes rare contractions . Good fetal movement, No vaginal bleeding, not leaking fluid.  [facility] 7 w[facility] - dated by LMP c/w 7 wk u/s - Pt with mild MR. 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, mild MR, no pulm htn, cards recc beta blocker if needed for arrythmia - GERD, on meds - Anemia - prior c/s for fetal distress, baby in NICU for 22 days at term and needed ECMO   Prenatal Transfer Tool  Maternal Diabetes: No Genetic Screening: Normal Maternal Ultrasounds/Referrals: Normal Fetal Ultrasounds or other Referrals:  None Maternal Substance Abuse:  No Significant Maternal Medications:  None Significant Maternal Lab Results: None     OB History    Gravida Para Term Preterm AB Living   2 1 1  0 0 1   SAB TAB Ectopic Multiple Live Births   0 0 0 0 1     Past Medical History:  Diagnosis Date  . Anemia   . GERD (gastroesophageal reflux disease)   . Migraines   . MVP (mitral valve prolapse)    Past Surgical History:  Procedure Laterality Date  . CESAREAN SECTION    . TONSILLECTOMY AND ADENOIDECTOMY    . WISDOM TOOTH EXTRACTION     Family History: family history includes Hypertension in her father. Social History:  reports that she has never smoked. She has never used smokeless tobacco. She reports that she does not drink alcohol or use drugs.  Review of Systems - Negative except discomfort of preg     Blood pressure 109/64, pulse 71, temperature 98.2 F (36.8 C), temperature source Oral, height 5\' 4"  (1.626 m), weight 76.2 kg (168 lb).  Physical Exam:  Gen: well appearing, no distress Back: no CVAT Abd: gravid, NT, no RUQ pain LE: no edema, equal bilaterally, non-tender   Prenatal labs: ABO, Rh: --/--/O POS, O POS (07/30 1050) Antibody: NEG (07/30 1050) Rubella: !Error! immune RPR: Non Reactive (07/30 1040)  HBsAg: Negative (01/10 0000)  HIV: Non-reactive (01/10 0000)  GBS:    neg 1 hr Glucola 114  Genetic screening nl NT, nl AFP Anatomy US nl  CBC    Component Value Date/Time   WBC 9.7 02/01/2017 1040   RBC 3.81 (L) 02/01/2017 1040   HGB 11.8 (L) 02/01/2017 1040   HGB 11.5 (L) 01/07/2014 1719   HCT 36.7 02/01/2017 1040   HCT 35.1 01/07/2014 1719   PLT 172 02/01/2017 1040   PLT 201 01/07/2014 1719   MCV 96.3 02/01/2017 1040   MCV 87.0 06/16/2015 1307   MCV 94 01/07/2014 1719   MCH 31.0 02/01/2017 1040   MCHC 32.2 02/01/2017 1040   RDW 15.0 02/01/2017 1040   RDW 13.8 01/07/2014 1719   LYMPHSABS 1.7 01/07/2014 1719   MONOABS 0.7 01/07/2014 1719   EOSABS 0.1 01/07/2014 1719   BASOSABS 0.0 01/07/2014 1719     Assessment/Plan: 30 y.o. G2P1001 at 5951w0d RCS. R/B d/w pt OK for Ancef. Clinda allergy   Delancey Moraes A. 02/02/2017, 7:15 AM

## 2017-02-02 NOTE — Consult Note (Signed)
Neonatology Note:   Attendance at C-section:    I was asked by Dr. Fogleman to attend this repeat C/S at term. The mother is a G2P1 O pos, GBS neg with mitral valve prolapse and uncomplicated pregnancy. ROM at delivery, fluid clear. Infant vigorous with good spontaneous cry and tone. Needed only minimal bulb suctioning. Ap 9/9. Lungs clear to ausc in DR. Has bilateral transverse palmar creases, but no clinodactyly. To CN to care of Pediatrician.   Judy Bearman C. Laterra Lubinski, MD 

## 2017-02-02 NOTE — Anesthesia Postprocedure Evaluation (Signed)
Anesthesia Post Note  Patient: Judy BenderLindsay D [name]  Procedure(s) Performed: Procedure(s) (LRB): Repeat CESAREAN SECTION (N/A)     Patient location during evaluation: Mother Baby Anesthesia Type: Spinal Level of consciousness: awake and alert and oriented Pain management: satisfactory to patient Vital Signs Assessment: post-procedure vital signs reviewed and stable Respiratory status: respiratory function stable and spontaneous breathing Cardiovascular status: blood pressure returned to baseline Postop Assessment: no headache, no backache, spinal receding, patient able to bend at knees, adequate PO intake and no signs of nausea or vomiting Anesthetic complications: no    Last Vitals:  Vitals:   02/02/17 1136 02/02/17 1300  BP: (!) 101/58 (!) 85/50  Pulse: 85 80  Resp: 18 18  Temp: 36.8 C 36.8 C    Last Pain:  Vitals:   02/02/17 1300  TempSrc: Axillary  PainSc:    Pain Goal:                 Kimber Esterly

## 2017-02-03 LAB — CBC
HEMATOCRIT: 30.7 % — AB (ref 36.0–46.0)
Hemoglobin: 9.9 g/dL — ABNORMAL LOW (ref 12.0–15.0)
MCH: 31.5 pg (ref 26.0–34.0)
MCHC: 32.2 g/dL (ref 30.0–36.0)
MCV: 97.8 fL (ref 78.0–100.0)
Platelets: 148 10*3/uL — ABNORMAL LOW (ref 150–400)
RBC: 3.14 MIL/uL — AB (ref 3.87–5.11)
RDW: 15.1 % (ref 11.5–15.5)
WBC: 11.8 10*3/uL — AB (ref 4.0–10.5)

## 2017-02-03 MED ORDER — MAGNESIUM OXIDE 400 (241.3 MG) MG PO TABS
400.0000 mg | ORAL_TABLET | Freq: Every day | ORAL | Status: DC
  Administered 2017-02-03 – 2017-02-04 (×2): 400 mg via ORAL
  Filled 2017-02-03 (×3): qty 1

## 2017-02-03 MED ORDER — POLYSACCHARIDE IRON COMPLEX 150 MG PO CAPS
150.0000 mg | ORAL_CAPSULE | Freq: Every day | ORAL | Status: DC
  Administered 2017-02-03 – 2017-02-04 (×2): 150 mg via ORAL
  Filled 2017-02-03 (×2): qty 1

## 2017-02-03 MED ORDER — OXYCODONE HCL 5 MG PO TABS
5.0000 mg | ORAL_TABLET | ORAL | Status: DC | PRN
  Administered 2017-02-03 – 2017-02-04 (×4): 5 mg via ORAL
  Filled 2017-02-03 (×2): qty 1

## 2017-02-03 MED ORDER — OXYCODONE HCL 5 MG PO TABS
10.0000 mg | ORAL_TABLET | ORAL | Status: DC | PRN
  Administered 2017-02-03 (×2): 10 mg via ORAL
  Filled 2017-02-03 (×4): qty 2

## 2017-02-03 NOTE — Lactation Note (Signed)
This note was copied from a baby's chart. Lactation Consultation Note  Patient Name: Judy [name] ZOXWR'UToday's Date: 02/03/2017 Reason for consult: Initial assessment;Difficult latch Breastfeeding consultation services and support information given and reviewed.  Baby is 7428 hours old and having difficulty sustaining latch.  Mom has flat nipples.  She has started pumping with symphony pump and obtaining drops.  Baby awake and showing feeding cues.  Positioned in football hold on left side.  Baby fussy and unable to latch.  24 mm nipple shield applied and baby latched well after a few attempts and fed actively.  Baby fed for 15 minutes and was still on breast when I left room.  Instructed to feed with cues.  Discussed cluster feeding.  Post pump every 3 hours and give any expressed milk back to baby.  Encouraged to call for assist prn.  Maternal Data Has patient been taught Hand Expression?: Yes Does the patient have breastfeeding experience prior to this delivery?: Yes  Feeding Feeding Type: Breast Fed  LATCH Score Latch: Grasps breast easily, tongue down, lips flanged, rhythmical sucking. (WITH 24 MM NIPPLE SHIELD)  Audible Swallowing: A few with stimulation  Type of Nipple: Flat  Comfort (Breast/Nipple): Soft / non-tender  Hold (Positioning): Assistance needed to correctly position infant at breast and maintain latch.  LATCH Score: 7  Interventions Interventions: Breast feeding basics reviewed;Assisted with latch;Skin to skin;Breast compression;Adjust position;Support pillows;Breast massage;Hand express  Lactation Tools Discussed/Used Tools: Nipple Shields Nipple shield size: 24   Consult Status Consult Status: Follow-up Date: 02/04/17 Follow-up type: In-patient    Huston FoleyMOULDEN, Bretta Fees S 02/03/2017, 12:16 PM

## 2017-02-03 NOTE — Progress Notes (Signed)
POSTOPERATIVE DAY # 1 S/P Elective Repeat LTCS   S:         Reports feeling okay - reports a severe headache this morning with some neck stiffness and states she may have slept on it wrong             Tolerating po intake / no nausea / no vomiting / + flatus / no BM  Denies dizziness, SOB, or CP             Bleeding is light             Pain controlled with Motrin, Tylenol, Oxycodone IR             Up ad lib / ambulatory/ voiding QS  Newborn breast feeding - but states the baby has not latched well and would like to see LC today, states she has been trying to hand express/pump colostrum  / Circumcision - planning either today or tomorrow depending on feedings   O:  VS: BP (!) 97/51 (BP Location: Right Arm)   Pulse 60   Temp 97.9 F (36.6 C) (Oral)   Resp 18   Ht 5\' 4"  (1.626 m)   Wt 76.2 kg (168 lb)   SpO2 100%   Breastfeeding? Unknown   BMI 28.84 kg/m    LABS:               Recent Labs  02/01/17 1040 02/03/17 0529  WBC 9.7 11.8*  HGB 11.8* 9.9*  PLT 172 148*               Bloodtype: --/--/O POS, O POS (07/30 1050)  Rubella: Immune (01/10 0000)                                             I&O: Intake/Output      07/31 0701 - 08/01 0700 08/01 0701 - 08/02 0700   P.O. 850    I.V. (mL/kg) 4750 (62.3)    Total Intake(mL/kg) 5600 (73.5)    Urine (mL/kg/hr) 3950 (2.2)    Other 250    Blood 700    Total Output 4900     Net +700                       Physical Exam:             Alert and Oriented X3  Lungs: Clear and unlabored  Heart: regular rate and rhythm / (+) murmur  Noted - pt. With hx of MVP  Abdomen: soft, non-tender, non-distended, active bowel sounds             Fundus: firm, non-tender, U-1             Dressing: pressure dressing removed by me, honeycomb dressing >75% with sanguinous drainage from yesterday, no new active bleeding             Incision:  approximated with sutures / no erythema / no ecchymosis / old drainage noted  Perineum: intact  Lochia:  appropriate, no clots  Extremities: no edema, no calf pain or tenderness, SCDs on  A:        POD # 1 S/P Elective repeat LTCS            ABL Anemia compounding IDA - stable, asymptomatic   Headache  P:  Routine postoperative care              Niferex 150mg  daily  Magnesium oxide 400mg  daily  Oxycodone IR PRN for pain  Discussed s/s of spinal headache - advised pt. To inform nurse if pain does not improve, normal BPs  Change honeycomb dressing today  May shower today  D/C IV today   Anticipate discharge home tomorrow  Judy JewsMeredith Marjie Chea, MSN, CNM [facility]

## 2017-02-04 DIAGNOSIS — G971 Other reaction to spinal and lumbar puncture: Secondary | ICD-10-CM | POA: Diagnosis not present

## 2017-02-04 MED ORDER — IBUPROFEN 600 MG PO TABS: 600.0000 mg | ORAL_TABLET | Freq: Four times a day (QID) | ORAL | 0 refills | Status: DC

## 2017-02-04 MED ORDER — ONDANSETRON 4 MG PO TBDP
4.0000 mg | ORAL_TABLET | Freq: Three times a day (TID) | ORAL | Status: DC | PRN
  Filled 2017-02-04: qty 1

## 2017-02-04 MED ORDER — OXYCODONE HCL 5 MG PO TABS: 5.0000 mg | ORAL_TABLET | ORAL | 0 refills | Status: DC | PRN

## 2017-02-04 NOTE — Progress Notes (Signed)
Updated Carlean JewsMeredith Sigmon CNM about patient's status regarding persistent headache with neck stiffness and new onset of nausea and shortness of breath. Vital signs stable and lungs are clear to ausculation. Warm compresses given for neck stiffness. Caffeine given for headache as well as 650mg  tylenol and 5mg  oxycodone. Connected patient to continuous pulse ox monitor per CNM order and encouraged use of incentive spirometer. Offered the zofran but patient wants to wait to see if the nausea will subside on its own after recent BM. Will continue to monitor.

## 2017-02-04 NOTE — Progress Notes (Signed)
C/o Headache and neck stiffness.  S/p labor epidural and vaginal delivery.  Onset of severe postural HA and neck stiffness last night with some nausea. 8/10. Better with supine position. Suspicious for post dural puncture HA.  This AM. Pt seen and examined. She is sitting up, breastfeeding. HA 2-3/10. Much better than last night.  Discussed options at length. Offered epidural blood patch. She declined at this time. Will continue caffeinated beverages and call or come back for blood patch if she does not continue to improve over the next few days. OK for discharge when ready.  [employer]

## 2017-02-04 NOTE — Progress Notes (Signed)
Notified by RN at 11:30pm that pt. Was c/o another headache with neck stiffness.  Pt. Denied n/v at the time, and was given Oxycodone and Motrin.  I advised for anesthesia to come evaluate.  Anesthesia confirmed it is a spinal headache.   RN f/u with call at 0320 stating pt. Has not rested much due to cluster feeding.  Headache has improved some, but still a 6/10.  She now having some nausea without vomiting.  Anesthesia is planning to do a blood patch in the morning.  Zofran 4mg  ODT now.  Per RN, pt. Is shaky due to the nausea, and c/o some shortness of breath - per RN, lungs are clear, VSS, O2 sats 100%, unlabored breathing.  Advised to monitor breathing pattern closely, and place on continuous pulse oximetry. Encourage pt. To take slow, deep breaths, continue to use IS, and keep SCDs on while in bed.  Advised if she is SOB while laying flat, place a pillow slightly under her back to offer a tilt. Planning to supplement baby with formula, so mom can rest - also advised baby could go to nursery for a few hours while mom rests.  Per RN, pt. Is tolerating PO fluids, and has been drinking plenty of fluids.  Recommend caffeine drinks PRN for HA. Call for worsening s/s.  Will continue close f/u.   Carlean JewsMeredith Shalita Notte, MSN, CNM [facility]

## 2017-02-04 NOTE — Discharge Summary (Signed)
Obstetric Discharge Summary   Patient Name: Judy [name] DOB: 07/18/1985 MRN: [id number]  Date of Admission: 02/02/2017 Date of Discharge: 02/04/2017 Date of Delivery: 02/02/17 Gestational Age at Delivery: 5475w0d  Primary OB: [facility] - Dr. 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 immune RPR: Non Reactive (07/30 1040)  HBsAg: Negative (01/10 0000)  HIV: Non-reactive (01/10 0000)  GBS:   neg 1 hr Glucola 114          Genetic screening nl NT, nl AFP Anatomy US nl Admitting Diagnosis: Repeat cesarean section at 39 weeks   Secondary Diagnoses: Patient Active Problem List   Diagnosis Date Noted  . 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 Postpartum care following cesarean delivery (7/31) 02/02/2017  . S/P repeat low transverse C-section 02/02/2017  . Pregnancy 08/02/2013  . Mitral regurgitation 11/11/2011  . Patent foramen ovale 11/11/2011   Date of Delivery: 02/02/17 Delivered By: Dr. Nash DimmerFogleman, T. Bailey CNM Assist Delivery Type: repeat cesarean section, low transverse incision Anesthesia: spinal  Newborn Data: Live born female  Birth Weight: 7 lb 13.6 oz (3560 g) APGAR: 9, 9 Circ: 02/04/17  Postpartum Course  (Cesarean Section):  Patient's postpartum course was complicated by post dural headache - improving with Oxycodone IR, Motrin, Tylenol, and Caffeine.  She declined blood patch. By time of discharge on POD#2, her pain was controlled on oral pain medications; she had appropriate lochia and was ambulating, voiding without difficulty, tolerating regular diet and passing flatus.   She was deemed stable for discharge  to home.     Labs: CBC Latest Ref Rng & Units 02/03/2017 02/01/2017 06/16/2015  WBC 4.0 - 10.5 K/uL 11.8(H) 9.7 6.3  Hemoglobin 12.0 - 15.0 g/dL 5.6(O9.9(L) 11.8(L) 12.3  Hematocrit 36.0 - 46.0 % 30.7(L) 36.7 36.3(A)  Platelets 150 - 400 K/uL 148(L) 172 -   O POS  Physical exam:  BP 111/86 (BP Location: Right Arm)   Pulse 74   Temp 97.7 F (36.5 C) (Oral)   Resp 18   Ht 5\' 4"  (1.626 m)   Wt 76.2 kg (168 lb)   SpO2 100%   Breastfeeding? Unknown   BMI 28.84 kg/m  General: alert and no distress Pulm: normal respiratory effort Lochia: appropriate Abdomen: soft, NT Uterine Fundus: firm, below umbilicus Perineum: intact Incision: c/d/i, healing well, no significant drainage, no dehiscence, no significant erythema Extremities: No evidence of DVT seen on physical exam. Trace pedal edema.   Disposition: stable, discharge to home Baby Feeding: breast milk and formula Baby Disposition: home with mom  Contraception: Micronor OCPs  Rh Immune globulin given: N/A Rubella vaccine given: N/A Tdap vaccine given in AP or PP setting: UTD Flu vaccine given in AP or PP setting: none   Plan:  Judy [name] was discharged to home in good condition. [facility]  Discharge Instructions: Per After Visit Summary. Activity: Advance as tolerated. Pelvic rest for 6 weeks.  Refer to After Visit Summary Diet: Regular, Heart Healthy Discharge Medications: Allergies as of 02/04/2017      Reactions   Augmentin [amoxicillin-pot  Clavulanate] Nausea And Vomiting   Clindamycin/lincomycin Rash      Medication List    TAKE these medications   ferrous sulfate 325 (65 FE) MG EC tablet Take 325 mg by mouth daily with breakfast.   ibuprofen 600 MG tablet Commonly known as:  ADVIL,MOTRIN Take 1 tablet (600 mg total) by mouth every 6 (six) hours.   oxyCODONE 5 MG immediate release tablet Commonly known as:  Oxy IR/ROXICODONE Take 1 tablet (5 mg total) by  mouth every 4 (four) hours as needed for moderate pain.   prenatal multivitamin Tabs tablet Take 1 tablet by mouth daily at 12 noon.   ranitidine 150 MG tablet Commonly known as:  ZANTAC Take 150 mg by mouth 2 (two) times daily.      Outpatient follow up:  Follow-up Information    Noland FordyceFogleman, Kelly, MD. Schedule an appointment as soon as possible for a visit in 6 week(s).   Specialty:  Obstetrics and Gynecology Why:  Postpartum visit  Contact information: [address] [place] [id number] [phone]           Signed:  Carlean JewsMeredith Sigmon, MSN, CNM [facility]

## 2017-02-04 NOTE — Progress Notes (Signed)
Notified Carlean JewsMeredith Sigmon CNM about patient's continued headache. Motrin and Oxycodone 5mg  was given to alleviate headache. Brought pain from 8 to a 6. CNM advised anesthesia to come and evaluate for possible spinal headache. Dr. Jean RosenthalJackson called and came to evaluate patient. Will follow up in AM with possible blood patch.

## 2017-02-04 NOTE — Lactation Note (Signed)
This note was copied from a baby's chart. Lactation Consultation Note  Patient Name: Boy Baird CancerLindsay Denman ZOXWR'UToday's Date: 02/04/2017  Pecola LeisureBaby is now 5251 hours old.  Weight loss at 10%.  Mom has a possible spinal headache.  She states baby cluster fed last night.  She is seeing colostrum in nipple shield after each feeding.  Baby was given formula once.  Mom recently pumped 10 mls of transitional milk.  When baby returns from circumcision parents will give baby expressed milk.  Discussed weight loss and recommended mom continue to pump every 3 hours and give milk back to baby.  If baby cluster feeding and still acting hungry after supplemented with her milk instructed to give small amounts of formula.  Recommended parents keep a feeding diary after discharge.  Pediatrician appointment tomorrow.  Offered outpatient appointment but mom states she will call if desired.   Maternal Data    Feeding Feeding Type: Breast Fed Length of feed: 30 min  LATCH Score Latch: Repeated attempts needed to sustain latch, nipple held in mouth throughout feeding, stimulation needed to elicit sucking reflex.  Audible Swallowing: A few with stimulation  Type of Nipple: Everted at rest and after stimulation  Comfort (Breast/Nipple): Soft / non-tender  Hold (Positioning): Assistance needed to correctly position infant at breast and maintain latch.  LATCH Score: 7  Interventions    Lactation Tools Discussed/Used     Consult Status      Huston FoleyMOULDEN, Tymel Conely S 02/04/2017, 11:18 AM

## 2017-02-04 NOTE — Progress Notes (Signed)
POSTOPERATIVE DAY # 2 S/P Elective Repeat LTCS   S:         Reports feeling better today, states the rest, medications, and lying supine have helped.  She declined the blood patch today.  She still desires to go home today              Tolerating po intake / no nausea / no vomiting / + flatus / + BM x3  Denies dizziness, SOB, or CP this morning              Bleeding is light             Pain controlled with Motrin, Tylenol, and Oxycodone IR             Up ad lib / ambulatory/ voiding QS  Newborn breast feeding with formula supplementation  / Circumcision - planning today at 10:20am by Dr. Serena Colonelaavon   O:  VS: BP 111/86 (BP Location: Right Arm)   Pulse 74   Temp 97.7 F (36.5 C) (Oral)   Resp 18   Ht 5\' 4"  (1.626 m)   Wt 76.2 kg (168 lb)   SpO2 100%   Breastfeeding? Unknown   BMI 28.84 kg/m    LABS:               Recent Labs  02/01/17 1040 02/03/17 0529  WBC 9.7 11.8*  HGB 11.8* 9.9*  PLT 172 148*               Bloodtype: --/--/O POS, O POS (07/30 1050)  Rubella: Immune (01/10 0000)                                             I&O: Intake/Output      08/01 0701 - 08/02 0700 08/02 0701 - 08/03 0700   P.O. 500    Total Intake(mL/kg) 500 (6.6)    Urine (mL/kg/hr) 1500 (0.8)    Total Output 1500     Net -1000                       Physical Exam:             Alert and Oriented X3  Lungs: Clear and unlabored  Heart: regular rate and rhythm / (+) murmur - known hx of MVP  Abdomen: soft, non-tender, non-distended, active bowel sounds in all quadrants              Fundus: firm, non-tender, U-2             Dressing: honeycomb dressing with steri-strips c/d/i             Incision:  approximated with sutures / no erythema / no ecchymosis / no drainage  Perineum: intact  Lochia: appropriate, no clots  Extremities: trace pedal edema, no calf pain or tenderness  A:        POD # 2 S/P Elective Repeat LTCS            ABL Anemia compounding IDA - stable, asymptomatic   Post  dural Headache - stable, improving, declined blood patch today  Hx. Of Mitral Valve Prolapse   P:        Routine postoperative care              Discharge home today after circumcision  [facility] discharge booklet and instructions given  Pt. Wants to buy OTC iron supplement - advised to take daily  May continue stool softener PRN  F/U with Dr. Ernestina PennaFogleman in 6 weeks  Carlean JewsMeredith Sigmon, MSN, [facility]

## 2017-02-04 NOTE — Progress Notes (Signed)
Patient was able to get about 1.5 hours of sleep. She says she doesn't have any pain or nausea and that her shortness of breath has improved. Midwife updated.

## 2017-02-04 NOTE — Addendum Note (Signed)
Addendum  created 02/04/17 [id number] by Phillips Groutarignan, Biviana Saddler, MD   Sign clinical note

## 2017-08-19 ENCOUNTER — Other Ambulatory Visit: Payer: Self-pay

## 2017-08-19 ENCOUNTER — Ambulatory Visit: Payer: 59 | Admitting: Physician Assistant

## 2017-08-19 ENCOUNTER — Encounter: Payer: Self-pay | Admitting: Physician Assistant

## 2017-08-19 VITALS — BP 107/72 | HR 71 | Temp 98.0°F | Resp 16 | Ht 64.0 in | Wt 136.0 lb

## 2017-08-19 DIAGNOSIS — R002 Palpitations: Secondary | ICD-10-CM | POA: Diagnosis not present

## 2017-08-19 DIAGNOSIS — R42 Dizziness and giddiness: Secondary | ICD-10-CM | POA: Diagnosis not present

## 2017-08-19 DIAGNOSIS — R000 Tachycardia, unspecified: Secondary | ICD-10-CM | POA: Diagnosis not present

## 2017-08-19 LAB — POCT CBC
GRANULOCYTE PERCENT: 51.5 % (ref 37–80)
HCT, POC: 40.8 % (ref 37.7–47.9)
Hemoglobin: 13.3 g/dL (ref 12.2–16.2)
Lymph, poc: 3.1 (ref 0.6–3.4)
MCH: 29 pg (ref 27–31.2)
MCHC: 32.5 g/dL (ref 31.8–35.4)
MCV: 89.3 fL (ref 80–97)
MID (CBC): 0.5 (ref 0–0.9)
MPV: 8.2 fL (ref 0–99.8)
POC Granulocyte: 3.8 (ref 2–6.9)
POC LYMPH PERCENT: 42.3 %L (ref 10–50)
POC MID %: 6.2 %M (ref 0–12)
Platelet Count, POC: 266 10*3/uL (ref 142–424)
RBC: 4.57 M/uL (ref 4.04–5.48)
RDW, POC: 13.3 %
WBC: 7.4 10*3/uL (ref 4.6–10.2)

## 2017-08-19 LAB — GLUCOSE, POCT (MANUAL RESULT ENTRY): POC Glucose: 92 mg/dl (ref 70–99)

## 2017-08-19 MED ORDER — METOPROLOL SUCCINATE ER 25 MG PO TB24
25.0000 mg | ORAL_TABLET | Freq: Every day | ORAL | 0 refills | Status: DC
Start: 2017-08-19 — End: 2018-06-17

## 2017-08-19 NOTE — Progress Notes (Signed)
PRIMARY CARE AT [facility] [address], [place] [address] [phone]  Date:  08/19/2017   Name:  Judy [name]   DOB:  05-16-1986   MRN:  [id number]  PCP:  Patient, No Pcp Per    History of Present Illness:  Judy [name] is a 32 y.o. female patient with pmh of mitral regurgitation, patent foramen ovale, anemia who presents to PCP with  Chief Complaint  Patient presents with  . Dizziness    pressure headache, today, hppens when she standing  . Tachycardia    pt states earlier today her heart was beating rather fastx 1 day     Patient is here today with palpiations and feeling lightheaded as she was getting ready to get in a shower in the morning around 6: 30am.  Her heart was racing for about 10-15 minutes.  There was discomfort when this occurred.  She had shortness of breath.   No caffeine intake.   She has never had this happen before.   She could feel her stomach vibrating during the time.   Soon after, she has had a headache, with lightheadedness.  Headache 6-7/10 at the front of her head.  Never had nausea.  No blood or black stool.  Her periods are heavy the first day but then, regulate.   Caffeine intake 1 coffee/soda at lunch.      She has some numbness in her labs.   No changes to her diet.   No history of thyroid disease.   She had cold symptoms this last weekend, however she did not take any pseudoephedrine or phenylephrine she is aware of, but took an otc antihistamine.   Currently not breastfeeding  Patient Active Problem List   Diagnosis Date Noted  . 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 Postpartum care following cesarean delivery (7/31) 02/02/2017  . S/P repeat low transverse C-section 02/02/2017  . Pregnancy 08/02/2013  . Mitral regurgitation 11/11/2011  . Patent foramen ovale 11/11/2011    Past Medical History:  Diagnosis Date  . Anemia   . GERD (gastroesophageal reflux disease)   . Migraines   . MVP (mitral valve prolapse)      Past Surgical History:  Procedure Laterality Date  . CESAREAN SECTION    . CESAREAN SECTION N/A 02/02/2017   Procedure: Repeat CESAREAN SECTION;  Surgeon: Noland Fordyce, MD;  Location: [facility] BIRTHING SUITES;  Service: Obstetrics;  Laterality: N/A;  EDD: 02/09/17 Allergy: Augmentin, Clindamycin  . TONSILLECTOMY AND ADENOIDECTOMY    . WISDOM TOOTH EXTRACTION      Social History   Tobacco Use  . Smoking status: Never Smoker  . Smokeless tobacco: Never Used  Substance Use Topics  . Alcohol use: No    Alcohol/week: 0.0 oz    Comment: Rare  . Drug use: No    Family History  Problem Relation Age of Onset  . Hypertension Father     Allergies  Allergen Reactions  . Augmentin [Amoxicillin-Pot Clavulanate] Nausea And Vomiting  . Clindamycin/Lincomycin Rash    Medication list has been reviewed and updated.  Current Outpatient Medications on File Prior to Visit  Medication Sig Dispense Refill  . norethindrone (MICRONOR,CAMILA,ERRIN) 0.35 MG tablet Take 1 tablet by mouth daily.    . ferrous sulfate 325 (65 FE) MG EC tablet Take 325 mg by mouth daily with breakfast.    . ibuprofen (ADVIL,MOTRIN) 600 MG tablet Take 1 tablet (600 mg total) by mouth every 6 (six) hours. (Patient not taking: Reported on  08/19/2017) 30 tablet 0  . oxyCODONE (OXY IR/ROXICODONE) 5 MG immediate release tablet Take 1 tablet (5 mg total) by mouth every 4 (four) hours as needed for moderate pain. (Patient not taking: Reported on 08/19/2017) 30 tablet 0  . Prenatal Vit-Fe Fumarate-FA (PRENATAL MULTIVITAMIN) TABS tablet Take 1 tablet by mouth daily at 12 noon. (Patient not taking: Reported on 08/19/2017) 30 tablet 0  . ranitidine (ZANTAC) 150 MG tablet Take 150 mg by mouth 2 (two) times daily.     No current facility-administered medications on file prior to visit.     ROS ROS otherwise unremarkable unless listed above.  Physical Examination: BP 107/72   Pulse 71   Temp 98 F (36.7 C) (Oral)   Resp 16   Ht  5\' 4"  (1.626 m)   Wt 136 lb (61.7 kg)   LMP 08/05/2017 (LMP Unknown)   SpO2 100%   Breastfeeding? No   BMI 23.34 kg/m  Ideal Body Weight: Weight in (lb) to have BMI = 25: 145.3  Physical Exam  Constitutional: She is oriented to person, place, and time. She appears well-developed and well-nourished. No distress.  HENT:  Head: Normocephalic and atraumatic.  Right Ear: External ear normal.  Left Ear: External ear normal.  Eyes: Conjunctivae and EOM are normal. Pupils are equal, round, and reactive to light.  Cardiovascular: Normal rate. A regularly irregular rhythm present. Exam reveals no friction rub.  Murmur heard.  Systolic murmur is present. Pulses:      Radial pulses are 2+ on the right side, and 2+ on the left side.  Pulmonary/Chest: Effort normal. No respiratory distress.  Neurological: She is alert and oriented to person, place, and time.  Skin: She is not diaphoretic.  Psychiatric: She has a normal mood and affect. Her behavior is normal.    Results for orders placed or performed in visit on 08/19/17  POCT CBC  Result Value Ref Range   WBC 7.4 4.6 - 10.2 K/uL   Lymph, poc 3.1 0.6 - 3.4   POC LYMPH PERCENT 42.3 10 - 50 %L   MID (cbc) 0.5 0 - 0.9   POC MID % 6.2 0 - 12 %M   POC Granulocyte 3.8 2 - 6.9   Granulocyte percent 51.5 37 - 80 %G   RBC 4.57 4.04 - 5.48 M/uL   Hemoglobin 13.3 12.2 - 16.2 g/dL   HCT, POC 16.1 09.6 - 47.9 %   MCV 89.3 80 - 97 fL   MCH, POC 29.0 27 - 31.2 pg   MCHC 32.5 31.8 - 35.4 g/dL   RDW, POC 04.5 %   Platelet Count, POC 266 142 - 424 K/uL   MPV 8.2 0 - 99.8 fL  POCT glucose (manual entry)  Result Value Ref Range   POC Glucose 92 70 - 99 mg/dl    Assessment and Plan: [name] is a 32 y.o. female who is here today for cc of  Chief Complaint  Patient presents with  . Dizziness    pressure headache, today, hppens when she standing  . Tachycardia    pt states earlier today her heart was beating rather fastx 1 day  will  give low dose bb as we await visit with cardiology. Advised how to take pulse.  Advised precautions.   Racing heart beat - Plan: metoprolol succinate (TOPROL-XL) 25 MG 24 hr tablet, TSH  Palpitations - Plan: EKG 12-Lead, POCT CBC, POCT glucose (manual entry), Basic metabolic panel, TSH  Lightheaded - Plan:  metoprolol succinate (TOPROL-XL) 25 MG 24 hr tablet, TSH  Trena PlattStephanie English, PA-C [facility] 2/15/20191:10 PM  Addendum: contacted cardiology.  They reported her appt is 2/20 from the may date.  The staff stated patient was aware of appt.

## 2017-08-19 NOTE — Patient Instructions (Addendum)
Please take the medication as prescribed.  If you feel any of the nausea, dizziness, listed below, please check your heart rate.  If it is below 60, stop the medicine, and return here or go the the ED.   I am going to contact your cardiologist, and make this appointment closer.  Please await this call Metoprolol extended-release tablets What is this medicine? METOPROLOL (me TOE proe lole) is a beta-blocker. Beta-blockers reduce the workload on the heart and help it to beat more regularly. This medicine is used to treat high blood pressure and to prevent chest pain. It is also used to after a heart attack and to prevent an additional heart attack from occurring. This medicine may be used for other purposes; ask your health care provider or pharmacist if you have questions. COMMON BRAND NAME(S): toprol, Toprol XL What should I tell my health care provider before I take this medicine? They need to know if you have any of these conditions: -diabetes -heart or vessel disease like slow heart rate, worsening heart failure, heart block, sick sinus syndrome or Raynaud's disease -kidney disease -liver disease -lung or breathing disease, like asthma or emphysema -pheochromocytoma -thyroid disease -an unusual or allergic reaction to metoprolol, other beta-blockers, medicines, foods, dyes, or preservatives -pregnant or trying to get pregnant -breast-feeding How should I use this medicine? Take this medicine by mouth with a glass of water. Follow the directions on the prescription label. Do not crush or chew. Take this medicine with or immediately after meals. Take your doses at regular intervals. Do not take more medicine than directed. Do not stop taking this medicine suddenly. This could lead to serious heart-related effects. Talk to your pediatrician regarding the use of this medicine in children. While this drug may be prescribed for children as young as 6 years for selected conditions, precautions do  apply. Overdosage: If you think you have taken too much of this medicine contact a poison control center or emergency room at once. NOTE: This medicine is only for you. Do not share this medicine with others. What if I miss a dose? If you miss a dose, take it as soon as you can. If it is almost time for your next dose, take only that dose. Do not take double or extra doses. What may interact with this medicine? This medicine may interact with the following medications: -certain medicines for blood pressure, heart disease, irregular heart beat -certain medicines for depression, like monoamine oxidase (MAO) inhibitors, fluoxetine, or paroxetine -clonidine -dobutamine -epinephrine -isoproterenol -reserpine This list may not describe all possible interactions. Give your health care provider a list of all the medicines, herbs, non-prescription drugs, or dietary supplements you use. Also tell them if you smoke, drink alcohol, or use illegal drugs. Some items may interact with your medicine. What should I watch for while using this medicine? Visit your doctor or health care professional for regular check ups. Contact your doctor right away if your symptoms worsen. Check your blood pressure and pulse rate regularly. Ask your health care professional what your blood pressure and pulse rate should be, and when you should contact them. You may get drowsy or dizzy. Do not drive, use machinery, or do anything that needs mental alertness until you know how this medicine affects you. Do not sit or stand up quickly, especially if you are an older patient. This reduces the risk of dizzy or fainting spells. Contact your doctor if these symptoms continue. Alcohol may interfere with the effect of  this medicine. Avoid alcoholic drinks. What side effects may I notice from receiving this medicine? Side effects that you should report to your doctor or health care professional as soon as possible: -allergic reactions  like skin rash, itching or hives -cold or numb hands or feet -depression -difficulty breathing -faint -fever with sore throat -irregular heartbeat, chest pain -rapid weight gain -swollen legs or ankles Side effects that usually do not require medical attention (report to your doctor or health care professional if they continue or are bothersome): -anxiety or nervousness -change in sex drive or performance -dry skin -headache -nightmares or trouble sleeping -short term memory loss -stomach upset or diarrhea -unusually tired This list may not describe all possible side effects. Call your doctor for medical advice about side effects. You may report side effects to FDA at 1-800-FDA-1088. Where should I keep my medicine? Keep out of the reach of children. Store at room temperature between 15 and 30 degrees C (59 and 86 degrees F). Throw away any unused medicine after the expiration date. NOTE: This sheet is a summary. It may not cover all possible information. If you have questions about this medicine, talk to your doctor, pharmacist, or health care provider.  2018 Elsevier/Gold Standard (2013-02-24 14:41:37)  How to Take a Pulse Your pulse is the increase in pressure inside the blood vessels that carry blood from your heart to the rest of your body (arteries). Every time your heart beats, you can feel your pulse in an artery near the surface of your skin. You can easily feel your pulse in the artery in your wrist (radial artery) and in the artery in your neck (carotid artery). Taking your pulse can tell you how fast your heart is beating and whether it has a normal rhythm. You can also tell whether your heart is beating strongly or weakly. What you need to know about pulse rates Your pulse is the same as your heart rate. Both are measured in beats per minute (bpm). A normal resting heart rate varies depending on a person's age.  Infants under 1 year of age: Normal heart rate of 100-160  bpm.  Children 421-632 years of age: Normal heart rate of 90-150 bpm.  Children 312-315 years of age: Normal heart rate of 80-140 bpm.  Children 266-34 years of age: Normal heart rate of 70-120 bpm.  Everyone over 34 years of age: Normal heart rate of 60-100 bpm.  There can be a lot of variation in your pulse. It can be different depending on the time of day or the amount of exercise that you get. It changes with your fitness level. Many things can change the speed and regularity of your pulse. These include:  Exercise.  Fever.  Stress.  Heart problems.  Poor circulation.  Medicines.  How to take your pulse To take your pulse, all you need is a digital stopwatch or a clock or watch that has a second hand. The best time to measure your resting pulse is in the morning before you start moving around. Take it as soon as you wake up or after resting for about 10 minutes. There are no firm rules about how often to check your pulse. In general, it is a good idea to check your pulse at least once a month. Measuring your pulse is a good way to check your heart health. Checking your pulse before and after exercise can tell you if you are getting the right amount of exercise. This is called finding  your target heart rate. Your target heart rate depends on your age, fitness, and health. Ask your health care provider what would be a safe target heart rate for you during exercise. Radial Pulse To check the pulse in your radial artery: 1. Turn one hand palm-up and relax your arm. 2. Place the first two fingers of your other hand gently over your wrist, just below the base of your thumb. 3. Place your fingertips just inside the bone that runs along the outside of your arm. 4. Slowly increase pressure until you feel a pulsing beneath your fingers. You may need to move your fingers slightly. 5. Do not press too hard. Too much pressure may cut off blood supply. 6. Count how many pulse beats you feel in 1  minute. Or, count how many pulse beats you feel in 30 seconds and double that number. 7. Pay attention to the rhythm of the pulse. It should be steady and even.  Carotid Pulse To check the pulse in your carotid artery: 1. Place two fingers just to one side of your Adam's apple so that you feel a pulsing beneath your fingers. 2. Do not press too hard. Too much pressure may cut off blood supply and can make you dizzy. 3. Count how many pulse beats you feel in 1 minute. Or, count how many pulse beats you feel in 30 seconds and double that number. 4. Pay attention to the rhythm of the pulse. It should be steady and even.  Contact a health care provider if:  Your pulse is too slow or too fast.  Your pulse is weak or hard to find.  You have skipped beats or extra beats.  Your pulse has an irregular rhythm.  You have an abnormal pulse along with dizziness, fatigue, or shortness of breath. This information is not intended to replace advice given to you by your health care provider. Make sure you discuss any questions you have with your health care provider. Document Released: 12/27/2002 Document Revised: 01/10/2016 Document Reviewed: 11/26/2015 Elsevier Interactive Patient Education  2018 [employer].    IF you received an x-ray today, you will receive an invoice from [facility]. Please contact [facility] at [phone] with questions or concerns regarding your invoice.   IF you received labwork today, you will receive an invoice from [facility]. Please contact [facility] at [phone] with questions or concerns regarding your invoice.   Our billing staff will not be able to assist you with questions regarding bills from these companies.  You will be contacted with the lab results as soon as they are available. The fastest way to get your results is to activate your My Chart account. Instructions are located on the last page of this paperwork. If you have not heard from  [place] regarding the results in 2 weeks, please contact this office.

## 2017-08-20 LAB — BASIC METABOLIC PANEL
BUN/Creatinine Ratio: 17 (ref 9–23)
BUN: 13 mg/dL (ref 6–20)
CALCIUM: 9.8 mg/dL (ref 8.7–10.2)
CO2: 22 mmol/L (ref 20–29)
CREATININE: 0.77 mg/dL (ref 0.57–1.00)
Chloride: 105 mmol/L (ref 96–106)
GFR calc Af Amer: 117 mL/min/{1.73_m2} (ref >59)
GFR, EST NON AFRICAN AMERICAN: 102 mL/min/{1.73_m2} (ref >59)
Glucose: 88 mg/dL (ref 65–99)
Potassium: 4.4 mmol/L (ref 3.5–5.2)
Sodium: 144 mmol/L (ref 134–144)

## 2017-08-20 LAB — TSH: TSH: 2.62 u[IU]/mL (ref 0.450–4.500)

## 2017-10-13 ENCOUNTER — Encounter: Payer: Self-pay | Admitting: Physician Assistant

## 2018-06-16 ENCOUNTER — Observation Stay
Admission: EM | Admit: 2018-06-16 | Discharge: 2018-06-17 | Disposition: A | Discharge status: Home / Self Care | Payer: Commercial Managed Care - HMO | Attending: Internal Medicine | Admitting: Internal Medicine

## 2018-06-16 ENCOUNTER — Emergency Department: Payer: Commercial Managed Care - HMO

## 2018-06-16 ENCOUNTER — Other Ambulatory Visit: Payer: Self-pay

## 2018-06-16 ENCOUNTER — Ambulatory Visit: Payer: Self-pay

## 2018-06-16 ENCOUNTER — Encounter: Payer: Self-pay | Admitting: Emergency Medicine

## 2018-06-16 DIAGNOSIS — I341 Nonrheumatic mitral (valve) prolapse: Secondary | ICD-10-CM | POA: Diagnosis not present

## 2018-06-16 DIAGNOSIS — H579 Unspecified disorder of eye and adnexa: Secondary | ICD-10-CM

## 2018-06-16 DIAGNOSIS — Z7982 Long term (current) use of aspirin: Secondary | ICD-10-CM | POA: Insufficient documentation

## 2018-06-16 DIAGNOSIS — Z881 Allergy status to other antibiotic agents status: Secondary | ICD-10-CM | POA: Insufficient documentation

## 2018-06-16 DIAGNOSIS — H538 Other visual disturbances: Secondary | ICD-10-CM

## 2018-06-16 DIAGNOSIS — Z791 Long term (current) use of non-steroidal anti-inflammatories (NSAID): Secondary | ICD-10-CM | POA: Insufficient documentation

## 2018-06-16 DIAGNOSIS — H539 Unspecified visual disturbance: Principal | ICD-10-CM | POA: Insufficient documentation

## 2018-06-16 DIAGNOSIS — I493 Ventricular premature depolarization: Secondary | ICD-10-CM

## 2018-06-16 DIAGNOSIS — R42 Dizziness and giddiness: Secondary | ICD-10-CM | POA: Insufficient documentation

## 2018-06-16 DIAGNOSIS — Z79899 Other long term (current) drug therapy: Secondary | ICD-10-CM | POA: Insufficient documentation

## 2018-06-16 DIAGNOSIS — Z880 Allergy status to penicillin: Secondary | ICD-10-CM | POA: Insufficient documentation

## 2018-06-16 DIAGNOSIS — G459 Transient cerebral ischemic attack, unspecified: Secondary | ICD-10-CM

## 2018-06-16 DIAGNOSIS — K219 Gastro-esophageal reflux disease without esophagitis: Secondary | ICD-10-CM | POA: Insufficient documentation

## 2018-06-16 LAB — CBC
HCT: 40 % (ref 36.0–46.0)
Hemoglobin: 12.8 g/dL (ref 12.0–15.0)
MCH: 29.8 pg (ref 26.0–34.0)
MCHC: 32 g/dL (ref 30.0–36.0)
MCV: 93.2 fL (ref 80.0–100.0)
Platelets: 289 10*3/uL (ref 150–400)
RBC: 4.29 MIL/uL (ref 3.87–5.11)
RDW: 12.3 % (ref 11.5–15.5)
WBC: 8.9 10*3/uL (ref 4.0–10.5)
nRBC: 0 % (ref 0.0–0.2)

## 2018-06-16 LAB — URINALYSIS, COMPLETE (UACMP) WITH MICROSCOPIC
Bacteria, UA: NONE SEEN
Bilirubin Urine: NEGATIVE
Glucose, UA: NEGATIVE mg/dL
Hgb urine dipstick: NEGATIVE
Ketones, ur: NEGATIVE mg/dL
Leukocytes, UA: NEGATIVE
Nitrite: NEGATIVE
Protein, ur: NEGATIVE mg/dL
Specific Gravity, Urine: 1.004 — ABNORMAL LOW (ref 1.005–1.030)
pH: 7 (ref 5.0–8.0)

## 2018-06-16 LAB — BASIC METABOLIC PANEL
Anion gap: 8 (ref 5–15)
BUN: 13 mg/dL (ref 6–20)
CO2: 24 mmol/L (ref 22–32)
CREATININE: 0.78 mg/dL (ref 0.44–1.00)
Calcium: 9 mg/dL (ref 8.9–10.3)
Chloride: 107 mmol/L (ref 98–111)
GFR calc Af Amer: >60 mL/min (ref >60)
GFR calc non Af Amer: >60 mL/min (ref >60)
Glucose, Bld: 114 mg/dL — ABNORMAL HIGH (ref 70–99)
POTASSIUM: 3.6 mmol/L (ref 3.5–5.1)
Sodium: 139 mmol/L (ref 135–145)

## 2018-06-16 LAB — LIPID PANEL
CHOLESTEROL: 167 mg/dL (ref 0–200)
HDL: 44 mg/dL (ref >40)
LDL Cholesterol: 103 mg/dL — ABNORMAL HIGH (ref 0–99)
Total CHOL/HDL Ratio: 3.8 RATIO
Triglycerides: 99 mg/dL (ref <150)
VLDL: 20 mg/dL (ref 0–40)

## 2018-06-16 LAB — TSH: TSH: 2.646 u[IU]/mL (ref 0.350–4.500)

## 2018-06-16 LAB — TROPONIN I: Troponin I: <0.03 ng/mL (ref <0.03)

## 2018-06-16 MED ORDER — STROKE: EARLY STAGES OF RECOVERY BOOK
Freq: Once | Status: AC
  Administered 2018-06-16: 22:00:00

## 2018-06-16 MED ORDER — ASPIRIN 300 MG RE SUPP: 300.0000 mg | Freq: Every day | RECTAL | Status: DC

## 2018-06-16 MED ORDER — ACETAMINOPHEN 160 MG/5ML PO SOLN: 650.0000 mg | ORAL | Status: DC | PRN

## 2018-06-16 MED ORDER — ENOXAPARIN SODIUM 40 MG/0.4ML ~~LOC~~ SOLN
40.0000 mg | SUBCUTANEOUS | Status: DC
  Administered 2018-06-16: 40 mg via SUBCUTANEOUS
  Filled 2018-06-16: qty 0.4

## 2018-06-16 MED ORDER — ACETAMINOPHEN 325 MG PO TABS: 650.0000 mg | ORAL_TABLET | ORAL | Status: DC | PRN

## 2018-06-16 MED ORDER — ASPIRIN 325 MG PO TABS
325.0000 mg | ORAL_TABLET | Freq: Every day | ORAL | Status: DC
  Administered 2018-06-16 – 2018-06-17 (×2): 325 mg via ORAL
  Filled 2018-06-16 (×2): qty 1

## 2018-06-16 MED ORDER — DILTIAZEM HCL ER COATED BEADS 120 MG PO CP24
120.0000 mg | ORAL_CAPSULE | Freq: Every day | ORAL | Status: DC
  Administered 2018-06-17: 120 mg via ORAL
  Filled 2018-06-16 (×2): qty 1

## 2018-06-16 MED ORDER — ACETAMINOPHEN 650 MG RE SUPP: 650.0000 mg | RECTAL | Status: DC | PRN

## 2018-06-16 NOTE — ED Provider Notes (Addendum)
[facility] Emergency Department Provider Note  ____________________________________________  Time seen: Approximately 5:37 PM  I have reviewed the triage vital signs and the nursing notes.   HISTORY  Chief Complaint No chief complaint on file.    HPI Judy [name] is a 32 y.o. female history of mitral regurgitation and PFO, chronic PVCs controlled with diltiazem, chronic migraines, presenting for "eyes pulling together" with blurry vision.  The patient states that she was at work, standing, when she had a sensation like her eyes were pulling together with blurry vision.  If she only open one eye at a time, her vision was normal.  Initially, she had stated that she had double vision but now states she cannot say for sure whether she was seeing multiple images.  This episode lasted 3 to 4 minutes and completely resolved.  Recently, over the past month, she reports an increase in fatigue and symptomatic PVCs which she has had for many many years.  She has not had any changes in her diltiazem; she recently saw an electrophysiologist, Dr. Melvyn Neth, at [facility] who is planning a 30-day Holter monitor.  The patient denies any associated unusual headache, although she has chronic migraines.  She did not have any numbness tingling or weakness, changes in mental status or speech, or difficulty walking.  Past Medical History:  Diagnosis Date  . Anemia   . GERD (gastroesophageal reflux disease)   . Migraines   . MVP (mitral valve prolapse)     Patient Active Problem List   Diagnosis Date Noted  . 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 Postpartum care following cesarean delivery (7/31) 02/02/2017  . S/P repeat low transverse C-section 02/02/2017  . Pregnancy 08/02/2013  . Mitral regurgitation 11/11/2011  . Patent foramen ovale 11/11/2011    Past Surgical History:  Procedure Laterality Date  . CESAREAN SECTION    . CESAREAN SECTION N/A 02/02/2017   Procedure: Repeat  CESAREAN SECTION;  Surgeon: Noland Fordyce, MD;  Location: [facility] BIRTHING SUITES;  Service: Obstetrics;  Laterality: N/A;  EDD: 02/09/17 Allergy: Augmentin, Clindamycin  . TONSILLECTOMY AND ADENOIDECTOMY    . WISDOM TOOTH EXTRACTION      Current Outpatient Rx  . Order #: [id number] Class: Historical Med  . Order #: [id number] Class: Normal  . Order #: [id number] Class: Normal  . Order #: [id number] Class: Historical Med  . Order #: [id number] Class: Print  . Order #: [id number] Class: Print  . Order #: [id number] Class: Historical Med    Allergies Augmentin [amoxicillin-pot clavulanate] and Clindamycin/lincomycin  Family History  Problem Relation Age of Onset  . Hypertension Father     Social History Social History   Tobacco Use  . Smoking status: Never Smoker  . Smokeless tobacco: Never Used  Substance Use Topics  . Alcohol use: No    Alcohol/week: 0.0 standard drinks    Comment: Rare  . Drug use: No    Review of Systems Constitutional: No fever/chills.  Lightheadedness or syncope. Eyes: Positive blurred vision and a sensation of eye pulling. ENT: No sore throat. No congestion or rhinorrhea. Cardiovascular: Denies chest pain. Denies palpitations. Respiratory: Denies shortness of breath.  No cough. Gastrointestinal: No abdominal pain.  No nausea, no vomiting.  No diarrhea.  No constipation. Genitourinary: Negative for dysuria. Musculoskeletal: Negative for back pain. Skin: Negative for rash. Neurological: As of for chronic unchanged headaches. No focal numbness, tingling or weakness.  No changes in speech, mental status.  No difficulty walking.    ____________________________________________   PHYSICAL EXAM:  VITAL  SIGNS: ED Triage Vitals  Enc Vitals Group     BP 06/16/18 1534 (!) 154/64     Pulse Rate 06/16/18 1534 (!) 41     Resp 06/16/18 1534 15     Temp 06/16/18 1534 97.7 F (36.5 C)     Temp Source 06/16/18 1534 Oral     SpO2 06/16/18 1534 100 %     Weight  06/16/18 1535 136 lb 0.4 oz (61.7 kg)     Height --      Head Circumference --      Peak Flow --      Pain Score 06/16/18 1534 0     Pain Loc --      Pain Edu? --      Excl. in GC? --     Constitutional: Alert and oriented.  Answers questions appropriately. Eyes: Conjunctivae are normal.  EOMI. PERRLA.  No horizontal or vertical nystagmus.  No scleral icterus. Head: Atraumatic. Nose: No congestion/rhinnorhea. Mouth/Throat: Mucous membranes are moist.  Neck: No stridor.  Supple.  Positive JVD.  No meningismus. Cardiovascular: Normal rate, regular rhythm. Holosystolic murmurs,  Without rubs or gallops.  Respiratory: Normal respiratory effort.  No accessory muscle use or retractions. Lungs CTAB.  No wheezes, rales or ronchi. Gastrointestinal: Soft, nontender and nondistended.  No guarding or rebound.  No peritoneal signs. Musculoskeletal: No LE edema. No ttp in the calves or palpable cords.  Negative Homan's sign. Neurologic: Alert and oriented 3. Speech is clear. Face and smile symmetric. Tongue is midline.  EOMI.  PERRLA.  No horizontal or vertical nystagmus.  No pronator drift. 5 out of 5 grip, biceps, triceps, hip flexors, plantar flexion and dorsiflexion. Normal sensation to light touch in the bilateral upper and lower extremities, and face. Normal gait without ataxia. Skin:  Skin is warm, dry and intact. No rash noted. Psychiatric: Anxious mood and affect.  Good insight.  ____________________________________________   LABS (all labs ordered are listed, but only abnormal results are displayed)  Labs Reviewed  BASIC METABOLIC PANEL - Abnormal; Notable for the following components:      Result Value   Glucose, Bld 114 (*)    All other components within normal limits  CBC  TROPONIN I  URINALYSIS, COMPLETE (UACMP) WITH MICROSCOPIC  TSH  POC URINE PREG, ED   ____________________________________________  EKG  ED ECG REPORT I, Anne-Caroline Sharma CovertNorman, the attending physician,  personally viewed and interpreted this ECG.   Date: 06/16/2018  EKG Time: 1537  Rate: 79  Rhythm: normal sinus rhythm; frequent PVC's  Axis: normal  Intervals:none  ST&T Change: No STEMI  ____________________________________________  RADIOLOGY  Dg Chest 2 View  Result Date: 06/16/2018 CLINICAL DATA:  32 year old female with double vision and dizziness. EXAM: CHEST - 2 VIEW COMPARISON:  None. FINDINGS: Normal lung volumes and mediastinal contours. Visualized tracheal air column is within normal limits. Both lungs are clear. No pneumothorax or pleural effusion. No osseous abnormality identified. Negative visible upper abdomen. IMPRESSION: Negative. Electronically Signed   By: Odessa FlemingH  Hall M.D.   On: 06/16/2018 16:17    ____________________________________________   PROCEDURES  Procedure(s) performed: None  Procedures  Critical Care performed: No ____________________________________________   INITIAL IMPRESSION / ASSESSMENT AND PLAN / ED COURSE  Pertinent labs & imaging results that were available during my care of the patient were reviewed by me and considered in my medical decision making (see chart for details).  32 y.o. female with a history of mitral valve prolapse and regurgitation, PFO, presenting  for 3 to 4-minute episode of eye pulling sensation with blurred vision.  Overall, the patient is hemodynamically stable and has no focal neurologic deficits on my examination.  The patient does have frequent PVCs on her EKG, and on my examination, which sounds like it is chronic for her.  However, I am concerned about a visual disturbance in a patient with a PFO.  The patient will be admitted to the hospital for stroke work-up and a CT scan is pending at this time.  Multiple sclerosis is also considered.  I have spoken with Dr. Thad Ranger, the neurologist on-call, who will see the patient as well.  It is also possible that the patient symptoms will not have any solvable etiology.  The  patient reports her last echocardiogram was in March of last year.  Plan admission.  ____________________________________________  FINAL CLINICAL IMPRESSION(S) / ED DIAGNOSES  Final diagnoses:  Blurred vision, bilateral  Abnormal sensation of eye  Frequent PVCs         NEW MEDICATIONS STARTED DURING THIS VISIT:  New Prescriptions   No medications on file      Rockne Menghini, MD 06/16/18 1745    Rockne Menghini, MD 06/16/18 [phone]

## 2018-06-16 NOTE — ED Notes (Signed)
Urine POC negative. 

## 2018-06-16 NOTE — H&P (Signed)
[facility] - [place] at [facility]   PATIENT NAME: Judy [name]    MR#:  [id number]  DATE OF BIRTH:  11/24/1985  DATE OF ADMISSION:  06/16/2018  PRIMARY CARE PHYSICIAN: Noland FordyceFogleman, Kelly, MD   REQUESTING/REFERRING PHYSICIAN: Dr. Sharma CovertNorman  CHIEF COMPLAINT:   Visual disturbance today and ongoing dizziness HISTORY OF PRESENT ILLNESS:  Judy [name]  is a 32 y.o. female with a known history of severe MR/mitral valve prolapse/history of PFO comes to the emergency room with blurred vision and dizziness. Patient did have some tingling in her fingers. She has no focal weakness. No dysarthria dysphagia. CT had negative. [facility]  patient received aspirin. ER physician spoke with Dr. Thad Rangereynolds. MRI ordered. Patient is being admitted for TIA/rule out stroke   PAST MEDICAL HISTORY:   Past Medical History:  Diagnosis Date  . Anemia   . GERD (gastroesophageal reflux disease)   . Migraines   . MVP (mitral valve prolapse)     PAST SURGICAL HISTOIRY:   Past Surgical History:  Procedure Laterality Date  . CESAREAN SECTION    . CESAREAN SECTION N/A 02/02/2017   Procedure: Repeat CESAREAN SECTION;  Surgeon: Noland FordyceFogleman, Kelly, MD;  [facility];  Service: Obstetrics;  Laterality: N/A;  EDD: 02/09/17 Allergy: Augmentin, Clindamycin  . TONSILLECTOMY AND ADENOIDECTOMY    . WISDOM TOOTH EXTRACTION      SOCIAL HISTORY:   Social History   Tobacco Use  . Smoking status: Never Smoker  . Smokeless tobacco: Never Used  Substance Use Topics  . Alcohol use: No    Alcohol/week: 0.0 standard drinks    Comment: Rare    FAMILY HISTORY:   Family History  Problem Relation Age of Onset  . Hypertension Father     DRUG ALLERGIES:   Allergies  Allergen Reactions  . Augmentin [Amoxicillin-Pot Clavulanate] Nausea And Vomiting  . Clindamycin/Lincomycin Rash    REVIEW OF SYSTEMS:  Review of Systems   Constitutional: Negative for chills, fever and weight loss.  HENT: Negative for ear discharge, ear pain and nosebleeds.   Eyes: Positive for blurred vision. Negative for pain and discharge.  Respiratory: Negative for sputum production, shortness of breath, wheezing and stridor.   Cardiovascular: Negative for chest pain, palpitations, orthopnea and PND.  Gastrointestinal: Negative for abdominal pain, diarrhea, nausea and vomiting.  Genitourinary: Negative for frequency and urgency.  Musculoskeletal: Negative for back pain and joint pain.  Neurological: Positive for tingling. Negative for sensory change, speech change, focal weakness and weakness.  Psychiatric/Behavioral: Negative for depression and hallucinations. The patient is not nervous/anxious.      MEDICATIONS AT HOME:   Prior to Admission medications   Medication Sig Start Date End Date Taking? Authorizing Provider  diltiazem (DILACOR XR) 120 MG 24 hr capsule Take 120 mg by mouth daily.   Yes [provider]  ibuprofen (ADVIL,MOTRIN) 200 MG tablet Take 200-400 mg by mouth every 6 (six) hours as needed for headache or mild pain.   Yes [provider]  norethindrone (MICRONOR,CAMILA,ERRIN) 0.35 MG tablet Take 1 tablet by mouth daily.   Yes [provider]  ibuprofen (ADVIL,MOTRIN) 600 MG tablet Take 1 tablet (600 mg total) by mouth every 6 (six) hours. Patient not taking: Reported on 08/19/2017 02/04/17   Karena AddisonSigmon, Meredith C, CNM  metoprolol succinate (TOPROL-XL) 25 MG 24 hr tablet Take 1 tablet (25 mg total) by mouth daily. 08/19/17   Garnetta BuddyEnglish, Stephanie D, PA  oxyCODONE (OXY IR/ROXICODONE) 5 MG immediate release tablet Take 1 tablet (5 mg total) by mouth every 4 (four) hours as needed for moderate pain. Patient not taking: Reported on 08/19/2017 02/04/17   Karena Addison, CNM  Prenatal Vit-Fe Fumarate-FA (PRENATAL MULTIVITAMIN) TABS tablet Take 1 tablet by mouth daily at 12 noon. Patient not taking: Reported on  08/19/2017 06/16/15   Elvina Sidle, MD      VITAL SIGNS:  Blood pressure 111/70, pulse 63, temperature 97.7 F (36.5 C), temperature source Oral, resp. rate 17, weight 61.7 kg, last menstrual period 06/10/2018, SpO2 100 %, not currently breastfeeding.  PHYSICAL EXAMINATION:  GENERAL:  32 y.o.-year-old patient lying in the bed with no acute distress.  EYES: Pupils equal, round, reactive to light and accommodation. No scleral icterus. Extraocular muscles intact.  HEENT: Head atraumatic, normocephalic. Oropharynx and nasopharynx clear.  NECK:  Supple, no jugular venous distention. No thyroid enlargement, no tenderness.  LUNGS: Normal breath sounds bilaterally, no wheezing, rales,rhonchi or crepitation. No use of accessory muscles of respiration.  CARDIOVASCULAR: S1, S2 normal. No murmurs, rubs, or gallops.  ABDOMEN: Soft, nontender, nondistended. Bowel sounds present. No organomegaly or mass.  EXTREMITIES: No pedal edema, cyanosis, or clubbing.  NEUROLOGIC: Cranial nerves II through XII are intact. Muscle strength 5/5 in all extremities. Sensation intact. Gait not checked.  PSYCHIATRIC: The patient is alert and oriented x 3.  SKIN: No obvious rash, lesion, or ulcer.   LABORATORY PANEL:   CBC Recent Labs  Lab 06/16/18 1541  WBC 8.9  HGB 12.8  HCT 40.0  PLT 289   ------------------------------------------------------------------------------------------------------------------  Chemistries  Recent Labs  Lab 06/16/18 1541  NA 139  K 3.6  CL 107  CO2 24  GLUCOSE 114*  BUN 13  CREATININE 0.78  CALCIUM 9.0   ------------------------------------------------------------------------------------------------------------------  Cardiac Enzymes Recent Labs  Lab 06/16/18 1541  TROPONINI <0.03   ------------------------------------------------------------------------------------------------------------------  RADIOLOGY:  Dg Chest 2 View  Result Date: 06/16/2018 CLINICAL  DATA:  32 year old female with double vision and dizziness. EXAM: CHEST - 2 VIEW COMPARISON:  None. FINDINGS: Normal lung volumes and mediastinal contours. Visualized tracheal air column is within normal limits. Both lungs are clear. No pneumothorax or pleural effusion. No osseous abnormality identified. Negative visible upper abdomen. IMPRESSION: Negative. Electronically Signed   By: Odessa Fleming M.D.   On: 06/16/2018 16:17   Ct Head Wo Contrast  Result Date: 06/16/2018 CLINICAL DATA:  Dizziness, double vision. EXAM: CT HEAD WITHOUT CONTRAST TECHNIQUE: Contiguous axial images were obtained from the base of the skull through the vertex without intravenous contrast. COMPARISON:  None. FINDINGS: Brain: No evidence of acute infarction, hemorrhage, hydrocephalus, extra-axial collection or mass lesion/mass effect. Vascular: No hyperdense vessel or unexpected calcification. Skull: Normal. Negative for fracture or focal lesion. Sinuses/Orbits: No acute finding. Other: None. IMPRESSION: Normal head CT. Electronically Signed   By: Lupita Raider, M.D.   On: 06/16/2018 17:53    EKG:  normal sinus rhythm  IMPRESSION AND PLAN:   Judy [name]  is a 32 y.o. female with a known history of severe MR/mitral valve prolapse/history of PFO comes to the emergency room with blurred vision and dizziness. Patient did have some tingling in her fingers. She has no focal weakness. No dysarthria dysphagia. CT had negative.  1. Visual disturbance with visual blurriness. Now resolved -history of PFO according to patient had echo with bubble study more than 10 years ago. Follows with [facility] cardiology for her mitral valve disease -admit to medical floor -  aspirin 81 mg daily -echo with bubble study -MRI of the brain -neurology consultation  2. History of mitral valve prolapse -continue diltiazem  3. DVT prophylaxis subcu Lovenox    All the records are reviewed and case discussed with ED provider. Management plans  discussed with the patient, family and they are in agreement.  CODE STATUS: full  TOTAL TIME TAKING CARE OF THIS PATIENT: *45* minutes.    Enedina Finner M.D on 06/16/2018 at 7:50 PM  Between 7am to 6pm - Pager - [phone]  After 6pm go to [email or web address] - password [email or web address] [facility]  [facility]  Office  [phone]  CC: Primary care physician; Noland Fordyce, MD

## 2018-06-16 NOTE — ED Triage Notes (Signed)
States has been having PVC's recently. Seen by EP this week and is scheduled to have a month long Holter monitor study.  States earlier today had some double vision.  Felt like eyes were being 'pulled together' almost cross eyed feeling.  States vision complaint resolved.  States also having intermittent episodes of dizziness.

## 2018-06-16 NOTE — ED Notes (Signed)
Attemtped to call report. RN will call back shorlty

## 2018-06-16 NOTE — Telephone Encounter (Signed)
Pt c/o  episode of blurred and double vision that occurred at 11:30 and lasted 2 minutes. Pt stated then change in vision resolved quickly. Pt stated she has a dull headache 1-2 out of 10 that began to hurt at the same time. Pt with h/o migraines.. Pt denies pain to her eyes. Pt also having dizziness that her cardiologist is aware of and is working her up for. Pt has a h/o PVC's and is waiting to wear a monitor for a month. Pt stated that when she has palpitations often it causes her to feel lightheaded. Pt has had dizziness for 2 weeks. Pt has not had dizziness before. Standing or walking make the dizziness worse.  Care advice given and pt verbalized understanding. Appt made for Monday morning.   Reason for Disposition . [1] Brief (now gone) blurred vision AND [2] unexplained . [1] MODERATE dizziness (e.g., interferes with normal activities) AND [2] has been evaluated by physician for this  Answer Assessment - Initial Assessment Questions 1. DESCRIPTION: "Describe your dizziness."     Can feel with palpitations and lightheaded being worked up by cardiologist and to wear holter moniter 2. LIGHTHEADED: "Do you feel lightheaded?" (e.g., somewhat faint, woozy, weak upon standing)     Yes when standing 3. VERTIGO: "Do you feel like either you or the room is spinning or tilting?" (i.e. vertigo)     no 4. SEVERITY: "How bad is it?"  "Do you feel like you are going to faint?" "Can you stand and walk?"   - MILD - walking normally   - MODERATE - interferes with normal activities (e.g., work, school)    - SEVERE - unable to stand, requires support to walk, feels like passing out now.      Mild  occasional moderate  has to sit down when becomes moderate- comes and goes 5. ONSET:  "When did the dizziness begin?"     2 weeks ago 6. AGGRAVATING FACTORS: "Does anything make it worse?" (e.g., standing, change in head position)   Standing or walk but not every time -more with activity 7. HEART RATE: "Can you  tell me your heart rate?" "How many beats in 15 seconds?"  (Note: not all patients can do this)       Pt doesn't know how 8. CAUSE: "What do you think is causing the dizziness?"     Palpitations- feels the dizziness when the palpitations are present and more consistent and more consistent 9. RECURRENT SYMPTOM: "Have you had dizziness before?" If so, ask: "When was the last time?" "What happened that time?"     no 10. OTHER SYMPTOMS: "Do you have any other symptoms?" (e.g., fever, chest pain, vomiting, diarrhea, bleeding)    No- had cold last week 11. PREGNANCY: "Is there any chance you are pregnant?" "When was your last menstrual period?"       No  06/10/18  Answer Assessment - Initial Assessment Questions 1. DESCRIPTION: "What is the vision loss like? Describe it for me." (e.g., complete vision loss, blurred vision, double vision, floaters, etc.)     Blurred vsion and double vision lasted 2 minutes. 2. LOCATION: "One or both eyes?" If one, ask: "Which eye?"     Both eyes 3. SEVERITY: "Can you see anything?" If so, ask: "What can you see?" (e.g., fine print)     At the time of call normal 4. ONSET: "When did this begin?" "Did it start suddenly or has this been gradual?"     11:30 suddenly  5. PATTERN: "Does this come and go, or has it been constant since it started?"     Come and gone 6. PAIN: "Is there any pain in your eye(s)?"  (Scale 1-10; or mild, moderate, severe)     no 7. CONTACTS-GLASSES: "Do you wear contacts or glasses?"     contacts 8. CAUSE: "What do you think is causing this visual problem?"     Pt doesn't know h/o dizziness and problems with heart palpitations (PVC's) 9. OTHER SYMPTOMS: "Do you have any other symptoms?" (e.g., confusion, headache, arm or leg weakness, speech problems)     Dull headache 1-2 forehead started hurting at the same time- left hand cramping more 10. PREGNANCY: "Is there any chance you are pregnant?" "When was your last menstrual period?"       No  LMP: last week 06/10/18  Protocols used: VISION LOSS OR CHANGE-A-AH, DIZZINESS - [facility]

## 2018-06-16 NOTE — ED Notes (Signed)
Patient transported to CT 

## 2018-06-17 ENCOUNTER — Observation Stay: Payer: Commercial Managed Care - HMO

## 2018-06-17 ENCOUNTER — Observation Stay (HOSPITAL_COMMUNITY): Payer: 59

## 2018-06-17 ENCOUNTER — Observation Stay (HOSPITAL_BASED_OUTPATIENT_CLINIC_OR_DEPARTMENT_OTHER)
Admit: 2018-06-17 | Discharge: 2018-06-17 | Disposition: A | Discharge status: Home / Self Care | Payer: Commercial Managed Care - HMO | Attending: Internal Medicine | Admitting: Internal Medicine

## 2018-06-17 DIAGNOSIS — G459 Transient cerebral ischemic attack, unspecified: Secondary | ICD-10-CM | POA: Diagnosis not present

## 2018-06-17 DIAGNOSIS — H538 Other visual disturbances: Secondary | ICD-10-CM | POA: Diagnosis not present

## 2018-06-17 DIAGNOSIS — I340 Nonrheumatic mitral (valve) insufficiency: Secondary | ICD-10-CM

## 2018-06-17 MED ORDER — GADOBUTROL 1 MMOL/ML IV SOLN
6.0000 mL | Freq: Once | INTRAVENOUS | Status: AC | PRN
  Administered 2018-06-17: 6 mL via INTRAVENOUS

## 2018-06-17 MED ORDER — ATORVASTATIN CALCIUM 40 MG PO TABS: 40.0000 mg | ORAL_TABLET | Freq: Every day | ORAL | 0 refills | Status: DC

## 2018-06-17 MED ORDER — ASPIRIN EC 81 MG PO TBEC: 81.0000 mg | DELAYED_RELEASE_TABLET | Freq: Every day | ORAL | 2 refills | Status: AC

## 2018-06-17 NOTE — Plan of Care (Signed)
  Problem: Education: Goal: Knowledge of General Education information will improve Description: Including pain rating scale, medication(s)/side effects and non-pharmacologic comfort measures Outcome: Progressing   Problem: Education: Goal: Knowledge of secondary prevention will improve Outcome: Progressing   

## 2018-06-17 NOTE — Progress Notes (Signed)
*  PRELIMINARY RESULTS* Echocardiogram 2D Echocardiogram has been performed.  Cristela BlueHege, Teea Ducey 06/17/2018, 9:30 AM

## 2018-06-17 NOTE — Discharge Summary (Signed)
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 Anemia   . GERD (gastroesophageal reflux disease)   . Migraines   . MVP (mitral valve prolapse)     HOSPITAL COURSE:  HPI  Judy [name]  is a 32 y.o. female with a known history of severe MR/mitral valve prolapse/history of PFO comes to the emergency room with blurred vision and dizziness. Patient did have some tingling in her fingers. She has no focal weakness. No dysarthria dysphagia. CT had negative. [facility]  patient received aspirin. ER physician spoke with Dr. Thad Rangereynolds. MRI ordered. Patient is being admitted for TIA/rule out stroke  1. Visual disturbance with visual blurriness. Now resolved-probably from TIA -history of PFO according to patient had echo with bubble study more than 10 years ago. Follows with [facility] cardiology for her mitral valve disease -MRI of the brain no acute findings. Echocardiogram with 55 to 60% ejection fraction, moderate mitral regurgitation Carotid Dopplers-no significant stenosis -LDL 108 -Seen by neurology recommending aspirin 81 mg enteric-coated and statin and outpatient follow-up with both cardiology and neurology  2. History of mitral valve prolapse -continue diltiazem And follow-up with [facility]ology at [facility] please call and make an appointment in a week  3.  Chronic dizziness Outpatient follow-up with cardiology for cardiac monitor and  neurology   DVT prophylaxis subcu Lovenox  DISCHARGE CONDITIONS:   Table  CONSULTS OBTAINED:  Treatment Team:  Kym Groomriadhosp, Neuro1, MD Thana Farreynolds, Leslie, MD   PROCEDURES   DRUG ALLERGIES:   Allergies  Allergen Reactions  . Augmentin [Amoxicillin-Pot Clavulanate] Nausea And Vomiting  . Clindamycin/Lincomycin Rash    DISCHARGE MEDICATIONS:   Allergies as of 06/17/2018      Reactions   Augmentin [amoxicillin-pot Clavulanate] Nausea And Vomiting   Clindamycin/lincomycin Rash      Medication List    STOP taking these medications   metoprolol succinate 25 MG 24 hr tablet Commonly known as:  TOPROL-XL   oxyCODONE 5 MG immediate release tablet Commonly known as:  Oxy IR/ROXICODONE     TAKE these medications   aspirin EC 81 MG tablet Take 1 tablet (81 mg total) by mouth daily.   atorvastatin 40 MG tablet Commonly known as:  LIPITOR Take 1 tablet (40 mg total) by mouth daily.   diltiazem 120 MG 24 hr capsule Commonly known as:  DILACOR XR Take 120 mg by mouth daily.   ibuprofen 200 MG tablet Commonly known as:  ADVIL,MOTRIN Take 200-400 mg by mouth every 6 (six) hours as needed for headache or mild pain. What changed:  Another medication with the same name was removed. Continue taking this medication, and follow the directions you see here.   norethindrone 0.35 MG tablet Commonly known as:  MICRONOR,CAMILA,ERRIN Take 1 tablet by mouth daily.   prenatal multivitamin Tabs tablet Take 1 tablet by mouth daily at 12 noon.        DISCHARGE INSTRUCTIONS:   Follow-up with primary care physician in 3 days Follow-up with  cardiology at [facility] in a week Follow-up with neurology in 2 weeks  DIET:  Cardiac diet  DISCHARGE CONDITION:  Fair  ACTIVITY:  Activity as tolerated  OXYGEN:  Home Oxygen: No.   Oxygen Delivery: room air  DISCHARGE LOCATION:  home   If you experience worsening of your admission symptoms, develop shortness of breath, life  threatening emergency, suicidal or homicidal thoughts you must seek medical attention immediately by calling 911 or calling your MD immediately  if symptoms less severe.  You Must read complete instructions/literature along with all the possible adverse reactions/side effects for all the Medicines you take and that have been prescribed to you. Take any new Medicines after you have completely understood and accpet all the possible adverse reactions/side effects.   Please note  You were cared for by a hospitalist during your hospital stay. If you have any questions about your discharge medications or the care you received while you were in the hospital after you are discharged, you can call the unit and asked to speak with the hospitalist on call if the hospitalist that took care of you is not available. Once you are discharged, your primary care physician will handle any further medical issues. Please note that NO REFILLS for any discharge medications will be authorized once you are discharged, as it is imperative that you return to your primary care physician (or establish a relationship with a primary care physician if you do not have one) for your aftercare needs so that they can reassess your need for medications and monitor your lab values.     Today  Patient is reporting chronic dizziness, scheduled to get a heart monitor from cardiologist office soon  ROS:  CONSTITUTIONAL: Denies fevers, chills. Denies any fatigue, weakness.  EYES: Denies blurry vision, double vision, eye pain. EARS, NOSE, THROAT: Denies tinnitus, ear pain, hearing loss. RESPIRATORY: Denies cough, wheeze, shortness of breath.  CARDIOVASCULAR: Denies chest pain, palpitations, edema.  GASTROINTESTINAL: Denies nausea, vomiting, diarrhea, abdominal pain. Denies bright red blood per rectum. GENITOURINARY: Denies dysuria, hematuria. ENDOCRINE: Denies nocturia or thyroid problems. HEMATOLOGIC AND LYMPHATIC: Denies easy bruising  or bleeding. SKIN: Denies rash or lesion. MUSCULOSKELETAL: Denies pain in neck, back, shoulder, knees, hips or arthritic symptoms.  NEUROLOGIC: Denies paralysis, paresthesias.  PSYCHIATRIC: Denies anxiety or depressive symptoms.   VITAL SIGNS:  Blood pressure (!) 94/58, pulse (!) 58, temperature 97.7 F (36.5 C), temperature source Oral, resp. rate 19, weight 61.7 kg, last menstrual period 06/10/2018, SpO2 99 %, not currently breastfeeding.  I/O:  No intake or output data in the 24 hours ending 06/17/18 1746  PHYSICAL EXAMINATION:  GENERAL:  32 y.o.-year-old patient lying in the bed with no acute distress.  EYES: Pupils equal, round, reactive to light and accommodation. No scleral icterus. Extraocular muscles intact.  HEENT: Head atraumatic, normocephalic. Oropharynx and nasopharynx clear.  NECK:  Supple, no jugular venous distention. No thyroid enlargement, no tenderness.  LUNGS: Normal breath sounds bilaterally, no wheezing, rales,rhonchi or crepitation. No use of accessory muscles of respiration.  CARDIOVASCULAR: S1, S2 normal. No murmurs, rubs, or gallops.  ABDOMEN: Soft, non-tender, non-distended. Bowel sounds present.   EXTREMITIES: No pedal edema, cyanosis, or clubbing.  NEUROLOGIC: Awake, alert and oriented x3 sensation intact. Gait not checked.  PSYCHIATRIC: The patient is alert and oriented x 3.  SKIN: No obvious rash, lesion, or ulcer.   DATA REVIEW:   CBC Recent Labs  Lab 06/16/18 1541  WBC 8.9  HGB 12.8  HCT 40.0  PLT 289    Chemistries  Recent Labs  Lab 06/16/18 1541  NA 139  K 3.6  CL 107  CO2 24  GLUCOSE 114*  BUN 13  CREATININE 0.78  CALCIUM 9.0    Cardiac Enzymes Recent Labs  Lab 06/16/18 1541  TROPONINI <0.03    Microbiology Results  Results for orders placed or performed during the hospital encounter of 01/08/14  OB RESULT CONSOLE Group B Strep     Status: None   Collection Time: 01/08/14 12:00 AM  Result Value Ref Range Status    GBS Negative  Final  OB RESULT CONSOLE Group B Strep     Status: None   Collection Time: 01/08/14 12:00 AM  Result Value Ref Range Status   GBS Negative  Final    RADIOLOGY:  Dg Chest 2 View  Result Date: 06/16/2018 CLINICAL DATA:  32 year old female with double vision and dizziness. EXAM: CHEST - 2 VIEW COMPARISON:  None. FINDINGS: Normal lung volumes and mediastinal contours. Visualized tracheal air column is within normal limits. Both lungs are clear. No pneumothorax or pleural effusion. No osseous abnormality identified. Negative visible upper abdomen. IMPRESSION: Negative. Electronically Signed   By: Odessa Fleming M.D.   On: 06/16/2018 16:17   Ct Head Wo Contrast  Result Date: 06/16/2018 CLINICAL DATA:  Dizziness, double vision. EXAM: CT HEAD WITHOUT CONTRAST TECHNIQUE: Contiguous axial images were obtained from the base of the skull through the vertex without intravenous contrast. COMPARISON:  None. FINDINGS: Brain: No evidence of acute infarction, hemorrhage, hydrocephalus, extra-axial collection or mass lesion/mass effect. Vascular: No hyperdense vessel or unexpected calcification. Skull: Normal. Negative for fracture or focal lesion. Sinuses/Orbits: No acute finding. Other: None. IMPRESSION: Normal head CT. Electronically Signed   By: Lupita Raider, M.D.   On: 06/16/2018 17:53   Mr Laqueta Jean And Wo Contrast  Result Date: 06/17/2018 CLINICAL DATA:  32 y/o F; episode of blurred and double vision that lasted 2 minutes. History of migraines and dizziness. EXAM: MRI HEAD WITHOUT AND WITH CONTRAST TECHNIQUE: Multiplanar, multiecho pulse sequences of the brain and surrounding structures were obtained without and with intravenous contrast. CONTRAST:  6 cc Gadavist COMPARISON:  06/16/2018 CT head FINDINGS: Brain: No acute infarction, hemorrhage, hydrocephalus, extra-axial collection or mass lesion. Single punctate focus T2 FLAIR hyperintensity within left superior frontal gyrus white matter of  unlikely clinical significance. After administration of intravenous contrast there is no abnormal enhancement of the brain. Vascular: Normal flow voids. Skull and upper cervical spine: Normal marrow signal. Sinuses/Orbits: Negative. Other: None. IMPRESSION: No acute intracranial process or abnormal enhancement of the brain. Unremarkable MRI of the brain. Electronically Signed   By: Mitzi Hansen M.D.   On: 06/17/2018 04:04    EKG:   Orders placed or performed during the hospital encounter of 06/16/18  . ED EKG within 10 minutes  . ED EKG within 10 minutes  . EKG 12-Lead  . EKG 12-Lead      Management plans discussed with the patient, family and they are in agreement.  CODE STATUS:     Code Status Orders  (From admission, onward)         Start     Ordered   06/16/18 2156  Full code  Continuous     06/16/18 2155        Code Status History    Date Active Date Inactive Code Status Order ID Comments User Context   02/02/2017 1049 02/04/2017 1734 Full Code [id number]  Ernestina Penna,  Tresa Endo, MD Inpatient   01/08/2014 1314 01/09/2014 2155 Full Code [id number]  Melancon, Hillery Hunter, MD Inpatient      TOTAL TIME TAKING CARE OF THIS PATIENT:43  minutes.   Note: This dictation was prepared with Dragon dictation along with smaller phrase technology. Any transcriptional errors that result from this process are unintentional.   @MEC @  on 06/17/2018 at 5:46 PM  Between 7am to 6pm - Pager - [phone]  After 6pm go to [email or web address] - password [email or web address] [facility]  [place] [facility]  Office  [phone]  CC: Primary care physician; Noland Fordyce, MD

## 2018-06-17 NOTE — Consult Note (Addendum)
Referring Physician: Ramonita Lab    Chief Complaint: Blurred vision  HPI: Judy [name] is an 32 y.o. female with past medical history of migraine headaches, mitral valve prolapse, anemia, and palpitations presenting to the ED on 06/16/2018 with chief complaints of episode of blurred vision. Patient reports onset of symptoms at 11:30 lasting 2 minutes with associated vertigo/dizziness. No symptoms of eye redness or pain and tearing associated with visual disturbance.  She describes episode as  feeling of her eyes being pulled together almost crossed eye feeling" followed by intermittent episode of dizziness and lightheadedness. Denies associated altered sensorium, speech abnormality, cranial nerve deficit, seizures, focal motor or sensory deficits, diplopia, nausea or vomiting, ipsilateral or contralateral paralysis/weakness, numbness or tingling, involuntary movements, tremor. No history of head injury or trauma. Patient states she has history of PVCs and  was recently evaluated at [facility] electrophysiology.  She had a 2-day Holter monitoring which was non-revealing.  She is therefore scheduled for 30-day mobile cardiac telemetry.  Initial NIH stroke scale was 0.  Initial CT head did not show acute intracranial abnormality.  Follow-up MRI of the brain was also negative for acute intracranial abnormality or lesion. She was therefore admitted for further stroke work-up and management.  Date last known well: Date: 06/16/2018 Time last known well: Time: 11:30 tPA Given: No: NIH stroke scale 0. Resolved symptoms.  Past Medical History:  Diagnosis Date  . Anemia   . GERD (gastroesophageal reflux disease)   . Migraines   . MVP (mitral valve prolapse)     Past Surgical History:  Procedure Laterality Date  . CESAREAN SECTION    . CESAREAN SECTION N/A 02/02/2017   Procedure: Repeat CESAREAN SECTION;  Surgeon: Noland Fordyce, MD;  Location: [facility] BIRTHING SUITES;  Service: Obstetrics;  Laterality: N/A;   EDD: 02/09/17 Allergy: Augmentin, Clindamycin  . TONSILLECTOMY AND ADENOIDECTOMY    . WISDOM TOOTH EXTRACTION      Family History  Problem Relation Age of Onset  . Hypertension Father    Social History:  reports that she has never smoked. She has never used smokeless tobacco. She reports that she does not drink alcohol or use drugs.  Allergies:  Allergies  Allergen Reactions  . Augmentin [Amoxicillin-Pot Clavulanate] Nausea And Vomiting  . Clindamycin/Lincomycin Rash    Medications:  I have reviewed the patient's current medications. Prior to Admission:  Medications Prior to Admission  Medication Sig Dispense Refill Last Dose  . diltiazem (DILACOR XR) 120 MG 24 hr capsule Take 120 mg by mouth daily.   06/16/2018 at 0600  . ibuprofen (ADVIL,MOTRIN) 200 MG tablet Take 200-400 mg by mouth every 6 (six) hours as needed for headache or mild pain.   Unknown at PRN  . norethindrone (MICRONOR,CAMILA,ERRIN) 0.35 MG tablet Take 1 tablet by mouth daily.   Taking  . ibuprofen (ADVIL,MOTRIN) 600 MG tablet Take 1 tablet (600 mg total) by mouth every 6 (six) hours. (Patient not taking: Reported on 08/19/2017) 30 tablet 0 Not Taking  . metoprolol succinate (TOPROL-XL) 25 MG 24 hr tablet Take 1 tablet (25 mg total) by mouth daily. 90 tablet 0   . oxyCODONE (OXY IR/ROXICODONE) 5 MG immediate release tablet Take 1 tablet (5 mg total) by mouth every 4 (four) hours as needed for moderate pain. (Patient not taking: Reported on 08/19/2017) 30 tablet 0 Not Taking  . Prenatal Vit-Fe Fumarate-FA (PRENATAL MULTIVITAMIN) TABS tablet Take 1 tablet by mouth daily at 12 noon. (Patient not taking: Reported on 08/19/2017) 30 tablet  0 Not Taking   Scheduled: . aspirin  300 mg Rectal Daily   Or  . aspirin  325 mg Oral Daily  . diltiazem  120 mg Oral Daily  . enoxaparin (LOVENOX) injection  40 mg Subcutaneous Q24H    ROS: History obtained from the patient   General ROS: negative for - chills, fatigue, fever, night  sweats, weight gain or weight loss Psychological ROS: negative for - behavioral disorder, hallucinations, memory difficulties, mood swings or suicidal ideation Ophthalmic ROS: Positive for - blurry vision, double vision, Negative for eye pain or loss of vision ENT ROS: negative for - epistaxis, nasal discharge, oral lesions, sore throat, tinnitus . Positive for vertigo Allergy and Immunology ROS: negative for - hives or itchy/watery eyes Hematological and Lymphatic ROS: negative for - bleeding problems, bruising or swollen lymph nodes Endocrine ROS: negative for - galactorrhea, hair pattern changes, polydipsia/polyuria or temperature intolerance Respiratory ROS: negative for - cough, hemoptysis, shortness of breath or wheezing Cardiovascular ROS: negative for - chest pain, dyspnea on exertion, edema or irregular heartbeat. Positive for palpitations Gastrointestinal ROS: negative for - abdominal pain, diarrhea, hematemesis, nausea/vomiting or stool incontinence Genito-Urinary ROS: negative for - dysuria, hematuria, incontinence or urinary frequency/urgency Musculoskeletal ROS: negative for - joint swelling or muscular weakness Neurological ROS: as noted in HPI Dermatological ROS: negative for rash and skin lesion changes  Physical Examination: Blood pressure 100/60, pulse (!) 58, temperature 97.7 F (36.5 C), temperature source Oral, resp. rate 19, weight 61.7 kg, last menstrual period 06/10/2018, SpO2 100 %, not currently breastfeeding.   HEENT-  Normocephalic, no lesions, without obvious abnormality.  Normal external eye and conjunctiva.  Normal TM's bilaterally.  Normal auditory canals and external ears. Normal external nose, mucus membranes and septum.  Normal pharynx. Cardiovascular- S1, S2 normal, pulses palpable throughout   Lungs- chest clear, no wheezing, rales, normal symmetric air entry Abdomen- soft, non-tender; bowel sounds normal; no masses,  no organomegaly Extremities- no  edema Lymph-no adenopathy palpable Musculoskeletal-no joint tenderness, deformity or swelling Skin-warm and dry, no hyperpigmentation, vitiligo, or suspicious lesions  Neurological Exam   Mental Status: Alert, oriented, thought content appropriate.  Speech fluent without evidence of aphasia.  Able to follow 3 step commands without difficulty. Attention span and concentration seemed appropriate  Cranial Nerves: II: Discs flat bilaterally; Visual fields grossly normal, pupils equal, round, reactive to light and accommodation III,IV, VI: ptosis not present, extra-ocular motions intact bilaterally V,VII: smile symmetric, facial light touch sensation intact VIII: hearing normal bilaterally IX,X: gag reflex present XI: bilateral shoulder shrug XII: midline tongue extension Motor: Right :  Upper extremity   5/5 Without pronator drift      Left: Upper extremity   5/5 without pronator drift Right:   Lower extremity   5/5                                          Left: Lower extremity   5/5 Tone and bulk:normal tone throughout; no atrophy noted Sensory: Pinprick and light touch intact bilaterally Deep Tendon Reflexes: 2+ and symmetric throughout Plantars: Right: downgoing                              Left: downgoing Cerebellar: Finger-to-nose testing intact bilaterally. Heel to shin testing normal bilaterally Gait: not tested due to safety concerns  Data Reviewed  Laboratory Studies:  Basic Metabolic Panel: Recent Labs  Lab 06/16/18 1541  NA 139  K 3.6  CL 107  CO2 24  GLUCOSE 114*  BUN 13  CREATININE 0.78  CALCIUM 9.0    Liver Function Tests: No results for input(s): AST, ALT, ALKPHOS, BILITOT, PROT, ALBUMIN in the last 168 hours. No results for input(s): LIPASE, AMYLASE in the last 168 hours. No results for input(s): AMMONIA in the last 168 hours.  CBC: Recent Labs  Lab 06/16/18 1541  WBC 8.9  HGB 12.8  HCT 40.0  MCV 93.2  PLT 289    Cardiac Enzymes: Recent Labs   Lab 06/16/18 1541  TROPONINI <0.03    BNP: Invalid input(s): POCBNP  CBG: No results for input(s): GLUCAP in the last 168 hours.  Microbiology: Results for orders placed or performed during the hospital encounter of 01/08/14  OB RESULT CONSOLE Group B Strep     Status: None   Collection Time: 01/08/14 12:00 AM  Result Value Ref Range Status   GBS Negative  Final  OB RESULT CONSOLE Group B Strep     Status: None   Collection Time: 01/08/14 12:00 AM  Result Value Ref Range Status   GBS Negative  Final    Coagulation Studies: No results for input(s): LABPROT, INR in the last 72 hours.  Urinalysis:  Recent Labs  Lab 06/16/18 1806  COLORURINE COLORLESS*  LABSPEC 1.004*  PHURINE 7.0  GLUCOSEU NEGATIVE  HGBUR NEGATIVE  BILIRUBINUR NEGATIVE  KETONESUR NEGATIVE  PROTEINUR NEGATIVE  NITRITE NEGATIVE  LEUKOCYTESUR NEGATIVE    Lipid Panel:    Component Value Date/Time   CHOL 167 06/16/2018 1541   TRIG 99 06/16/2018 1541   HDL 44 06/16/2018 1541   CHOLHDL 3.8 06/16/2018 1541   VLDL 20 06/16/2018 1541   LDLCALC 103 (H) 06/16/2018 1541    HgbA1C: No results found for: HGBA1C  Urine Drug Screen:  No results found for: LABOPIA, COCAINSCRNUR, LABBENZ, AMPHETMU, THCU, LABBARB  Alcohol Level: No results for input(s): ETH in the last 168 hours.  Other results: EKG: unchanged from previous tracings, PAC's noted. Vent. rate 79 BPM PR interval 126 ms QRS duration 92 ms QT/QTc 400/458 ms P-R-T axes 49 47 58 Imaging:  Dg Chest 2 View  Result Date: 06/16/2018 CLINICAL DATA:  32 year old female with double vision and dizziness. EXAM: CHEST - 2 VIEW COMPARISON:  None. FINDINGS: Normal lung volumes and mediastinal contours. Visualized tracheal air column is within normal limits. Both lungs are clear. No pneumothorax or pleural effusion. No osseous abnormality identified. Negative visible upper abdomen. IMPRESSION: Negative. Electronically Signed   By: Odessa Fleming M.D.   On:  06/16/2018 16:17   Ct Head Wo Contrast  Result Date: 06/16/2018 CLINICAL DATA:  Dizziness, double vision. EXAM: CT HEAD WITHOUT CONTRAST TECHNIQUE: Contiguous axial images were obtained from the base of the skull through the vertex without intravenous contrast. COMPARISON:  None. FINDINGS: Brain: No evidence of acute infarction, hemorrhage, hydrocephalus, extra-axial collection or mass lesion/mass effect. Vascular: No hyperdense vessel or unexpected calcification. Skull: Normal. Negative for fracture or focal lesion. Sinuses/Orbits: No acute finding. Other: None. IMPRESSION: Normal head CT. Electronically Signed   By: Lupita Raider, M.D.   On: 06/16/2018 17:53   Mr Laqueta Jean And Wo Contrast  Result Date: 06/17/2018 CLINICAL DATA:  32 y/o F; episode of blurred and double vision that lasted 2 minutes. History of migraines and dizziness. EXAM: MRI HEAD WITHOUT AND WITH CONTRAST TECHNIQUE: Multiplanar, multiecho  pulse sequences of the brain and surrounding structures were obtained without and with intravenous contrast. CONTRAST:  6 cc Gadavist COMPARISON:  06/16/2018 CT head FINDINGS: Brain: No acute infarction, hemorrhage, hydrocephalus, extra-axial collection or mass lesion. Single punctate focus T2 FLAIR hyperintensity within left superior frontal gyrus white matter of unlikely clinical significance. After administration of intravenous contrast there is no abnormal enhancement of the brain. Vascular: Normal flow voids. Skull and upper cervical spine: Normal marrow signal. Sinuses/Orbits: Negative. Other: None. IMPRESSION: No acute intracranial process or abnormal enhancement of the brain. Unremarkable MRI of the brain. Electronically Signed   By: Mitzi HansenLance  Furusawa-Stratton M.D.   On: 06/17/2018 04:04   Patient seen and examined.  Clinical course and management discussed.  Necessary edits performed.  I agree with the above.  Assessment and plan of care developed and discussed below.    Assessment: 32 y.o.  female with past medical history of migraine headaches, mitral valve prolapse, anemia, and palpitations presenting to the ED on 06/16/2018 with chief complaints of episode of blurred associated with dizziness. Symptoms now resolved. MRI brain reviewed and show no acute intracranial abnormality. Etiology unclear but cannot rule out TIA.  Patient states she was not on anticoagulant or antiplatelet prior to this event. LDL 103.    Stroke Risk Factors - family history  Plan: 1. HgbA1c 2. Prophylactic therapy-Antiplatelet med: Aspirin - dose 81 mg/day 3. Echocardiogram pending 4. Carotid dopplers 5. Telemetry monitoring 6. Frequent neuro checks 7. Statin for lipid management with target LDL<70. 8. Follow up with neurology and cardiology on an outpatient basis  This patient was staffed with Dr. Verlon AuLeslie, Thad Rangereynolds who personally evaluated patient, reviewed documentation and agreed with assessment and plan of care as above.  Webb SilversmithElizabeth Ouma, DNP, FNP-BC Board certified Nurse Practitioner Neurology Department  06/17/2018, 9:50 AM  Thana FarrLeslie Dametrius Sanjuan, MD Neurology [phone]  06/17/2018  12:23 PM

## 2018-06-17 NOTE — Discharge Instructions (Signed)
Follow-up with primary care physician in 3 days Follow-up with cardiology in a week Follow-up with neurology in 2 weeks

## 2018-06-17 NOTE — Progress Notes (Signed)
Pt BP was at 98/60, 97/55 and 94/50. Pt asymptomatic.Notify prime. Will continue to monitor.

## 2018-06-18 LAB — HIV ANTIBODY (ROUTINE TESTING W REFLEX): HIV Screen 4th Generation wRfx: NONREACTIVE

## 2018-06-20 ENCOUNTER — Ambulatory Visit: Payer: Self-pay | Admitting: Family Medicine

## 2018-11-01 ENCOUNTER — Ambulatory Visit: Payer: Self-pay

## 2018-11-01 NOTE — Telephone Encounter (Signed)
Patient called in with c/o abdominal pain to the right side. She says it's a dull, achy pain on the right side consistently since yesterday. She says the pain has been in her lower back for over a week and the pain to the abdomen off and on, but now consistent. She says the pain level is a 4, she says not that bad enough to bring her to her knees, but just enough to be worried about it. She says she's been somewhat constipated, nausea off and on today, but no other symptoms. I advised she may need to go to the ED for evaluation due to she hasn't been seen in the office since the last provider left, but I told her I would call the office to make sure this is something that can be evaluated virtually. I called and spoke to [place], [facility] who says she will need to transfer her care and asked to speak to the patient, the call was connected successfully.  Answer Assessment - Initial Assessment Questions 1. LOCATION: "Where does it hurt?"      Right side of abdomen, dull achy pain 2. RADIATION: "Does the pain shoot anywhere else?" (e.g., chest, back)     Lower back pain for over a week 3. ONSET: "When did the pain begin?" (e.g., minutes, hours or days ago)      Yesterday consistently, but off and on past week or so 4. SUDDEN: "Gradual or sudden onset?"     Gradual 5. PATTERN "Does the pain come and go, or is it constant?"    - If constant: "Is it getting better, staying the same, or worsening?"      (Note: Constant means the pain never goes away completely; most serious pain is constant and it progresses)     - If intermittent: "How long does it last?" "Do you have pain now?"     (Note: Intermittent means the pain goes away completely between bouts)     Constant today and yesterday 6. SEVERITY: "How bad is the pain?"  (e.g., Scale 1-10; mild, moderate, or severe)   - MILD (1-3): doesn't interfere with normal activities, abdomen soft and not tender to touch    - MODERATE (4-7): interferes with normal  activities or awakens from sleep, tender to touch    - SEVERE (8-10): excruciating pain, doubled over, unable to do any normal activities      4 7. RECURRENT SYMPTOM: "Have you ever had this type of abdominal pain before?" If so, ask: "When was the last time?" and "What happened that time?"      No 8. CAUSE: "What do you think is causing the abdominal pain?"     I don't know 9. RELIEVING/AGGRAVATING FACTORS: "What makes it better or worse?" (e.g., movement, antacids, bowel movement)     Nothing made it better or worse 10. OTHER SYMPTOMS: "Has there been any vomiting, diarrhea, constipation, or urine problems?"      Constipation somewhat, nausea off and on today 11. PREGNANCY: "Is there any chance you are pregnant?" "When was your last menstrual period?"       No; LMP Last month  Protocols used: ABDOMINAL PAIN - [facility]

## 2019-01-31 ENCOUNTER — Telehealth (HOSPITAL_COMMUNITY): Payer: Self-pay | Admitting: Obstetrics

## 2019-01-31 ENCOUNTER — Telehealth (HOSPITAL_COMMUNITY): Payer: Self-pay | Admitting: *Deleted

## 2019-01-31 NOTE — Telephone Encounter (Signed)
Received message from this patient that she would like to enroll in cardiac rehab.  Pt had MV Repair on 01/17/19 at [facility] by Dr. Lunette Stands.  Pt will follow up with Dr. Lunette Stands on 8/13.  Called and spoke to pt regarding Cardiac Rehab and our scheduling procedure. Will send MD order and request 12 lead ekg.  Will have support staff verify pt insurance  benefits and eligibility.  Once pt has completed her follow up and assess readiness to proceed with scheduling Cardiac Rehab.  Pt verbalized understanding. Cherre Huger, BSN Cardiac and Training and development officer      .

## 2019-02-02 NOTE — Telephone Encounter (Signed)
Pt insurance is active and benefits verified through [employer] $30, DED $2,500/$2,500 met, out of pocket $6,000/$5,517.53 met, co-insurance 0. no pre-authorization required. Passport, 02/01/2019'@12'$ :22pm, REF# [id number]  Will contact patient to see if she is interested in the Cardiac Rehab Program. If interested, patient will need to complete follow up appt. Once completed, patient will be contacted for scheduling upon review by the RN Navigator.

## 2019-02-23 ENCOUNTER — Telehealth (HOSPITAL_COMMUNITY): Payer: Self-pay | Admitting: Pharmacist

## 2019-02-26 ENCOUNTER — Emergency Department (HOSPITAL_COMMUNITY): Payer: 59

## 2019-02-26 ENCOUNTER — Other Ambulatory Visit: Payer: Self-pay

## 2019-02-26 ENCOUNTER — Ambulatory Visit (HOSPITAL_COMMUNITY)
Admission: EM | Admit: 2019-02-26 | Discharge: 2019-02-26 | Disposition: A | Discharge status: Home / Self Care | Payer: 59 | Source: Home / Self Care

## 2019-02-26 ENCOUNTER — Emergency Department (HOSPITAL_COMMUNITY)
Admission: EM | Admit: 2019-02-26 | Discharge: 2019-02-26 | Disposition: A | Discharge status: Home / Self Care | Payer: 59 | Attending: Emergency Medicine | Admitting: Emergency Medicine

## 2019-02-26 DIAGNOSIS — R002 Palpitations: Secondary | ICD-10-CM | POA: Diagnosis not present

## 2019-02-26 DIAGNOSIS — E876 Hypokalemia: Secondary | ICD-10-CM | POA: Diagnosis not present

## 2019-02-26 DIAGNOSIS — Z9889 Other specified postprocedural states: Secondary | ICD-10-CM | POA: Insufficient documentation

## 2019-02-26 DIAGNOSIS — I959 Hypotension, unspecified: Secondary | ICD-10-CM | POA: Insufficient documentation

## 2019-02-26 DIAGNOSIS — Z79899 Other long term (current) drug therapy: Secondary | ICD-10-CM | POA: Insufficient documentation

## 2019-02-26 DIAGNOSIS — Z7982 Long term (current) use of aspirin: Secondary | ICD-10-CM | POA: Insufficient documentation

## 2019-02-26 LAB — CBC
HCT: 42.6 % (ref 36.0–46.0)
Hemoglobin: 13.3 g/dL (ref 12.0–15.0)
MCH: 29.8 pg (ref 26.0–34.0)
MCHC: 31.2 g/dL (ref 30.0–36.0)
MCV: 95.3 fL (ref 80.0–100.0)
Platelets: 259 10*3/uL (ref 150–400)
RBC: 4.47 MIL/uL (ref 3.87–5.11)
RDW: 12.7 % (ref 11.5–15.5)
WBC: 8.2 10*3/uL (ref 4.0–10.5)
nRBC: 0 % (ref 0.0–0.2)

## 2019-02-26 LAB — BASIC METABOLIC PANEL
Anion gap: 14 (ref 5–15)
BUN: 7 mg/dL (ref 6–20)
CO2: 20 mmol/L — ABNORMAL LOW (ref 22–32)
Calcium: 9.3 mg/dL (ref 8.9–10.3)
Chloride: 106 mmol/L (ref 98–111)
Creatinine, Ser: 0.78 mg/dL (ref 0.44–1.00)
GFR calc Af Amer: >60 mL/min (ref >60)
GFR calc non Af Amer: >60 mL/min (ref >60)
Glucose, Bld: 104 mg/dL — ABNORMAL HIGH (ref 70–99)
Potassium: 3.2 mmol/L — ABNORMAL LOW (ref 3.5–5.1)
Sodium: 140 mmol/L (ref 135–145)

## 2019-02-26 LAB — MAGNESIUM: Magnesium: 1.9 mg/dL (ref 1.7–2.4)

## 2019-02-26 LAB — D-DIMER, QUANTITATIVE: D-Dimer, Quant: 0.78 ug/mL-FEU — ABNORMAL HIGH (ref 0.00–0.50)

## 2019-02-26 LAB — I-STAT BETA HCG BLOOD, ED (MC, WL, AP ONLY): I-stat hCG, quantitative: <5 m[IU]/mL (ref <5)

## 2019-02-26 LAB — TROPONIN I (HIGH SENSITIVITY): Troponin I (High Sensitivity): 5 ng/L (ref <18)

## 2019-02-26 LAB — PROTIME-INR
INR: 1 (ref 0.8–1.2)
Prothrombin Time: 13.2 seconds (ref 11.4–15.2)

## 2019-02-26 MED ORDER — POTASSIUM CHLORIDE CRYS ER 20 MEQ PO TBCR: 20.0000 meq | EXTENDED_RELEASE_TABLET | Freq: Every day | ORAL | 0 refills | Status: DC

## 2019-02-26 MED ORDER — SODIUM CHLORIDE 0.9% FLUSH: 3.0000 mL | Freq: Once | INTRAVENOUS | Status: DC

## 2019-02-26 MED ORDER — POTASSIUM CHLORIDE CRYS ER 20 MEQ PO TBCR
40.0000 meq | EXTENDED_RELEASE_TABLET | Freq: Once | ORAL | Status: AC
  Administered 2019-02-26: 40 meq via ORAL
  Filled 2019-02-26: qty 2

## 2019-02-26 NOTE — Discharge Instructions (Addendum)
1.  Follow-up with your cardiologist and family doctor soon as possible.  Return to the emergency department if you develop worsening or concerning symptoms.  Try to stay hydrated and take extra salt in your diet.  Also, increase your dietary potassium and take 20 mEq potassium as prescribed.  You should have your potassium level rechecked in the next 2 to 4 days.  Discussed with your doctor if you should be on continuous potassium more only take at times when your level is slightly low.

## 2019-02-26 NOTE — ED Notes (Signed)
Pt's blood pressure 90s/50s, switched to a smaller BP cuff and rechecked - 104/56

## 2019-02-26 NOTE — ED Notes (Signed)
Patient transported to ed by wheelchair and christina soto, rn

## 2019-02-26 NOTE — ED Triage Notes (Signed)
Pt presents from home via pov for hypotension and light headness for the past 2 days after heart surgery  6 weeks ago. SBp 80s, DBP 50s at home for 2 days.States that she can feel arrythmias usually and has felt this off and on for the past 2 days. Similar 2 day episode 2 weeks ago, was admitted to the hospital for observation for prolonged QT interval

## 2019-02-26 NOTE — ED Provider Notes (Signed)
[facility] Provider Note   CSN: [id number] Arrival date & time: 02/26/19  1520     History   Chief Complaint Chief Complaint  Patient presents with  . Hypotension    HPI Judy [name] is a 33 y.o. female.     HPI Patient reports that she is noting low blood pressures intermittently.  Sometimes her blood pressures as low as 80s over 60s.  She does report that her baseline blood pressures typically 90s over 60s.  She has not had any passing out episodes.  She reports if she gets up and feels lightheaded she is able to sit back down and wait till it passes.  She reports she is also experiencing some heart racing and skipping.  She just turned in a ZIO Patch monitor 2 days ago.  She reports she was symptomatic while she was wearing it.  She reports she does drink quite a bit of water and stays hydrated.  Patient is status post mitral valve repair and takes aspirin and Plavix daily.  She has some as needed diltiazem for palpitations but reports that she is very rarely actually taking it. Past Medical History:  Diagnosis Date  . Anemia   . GERD (gastroesophageal reflux disease)   . Migraines   . MVP (mitral valve prolapse)     Patient Active Problem List   Diagnosis Date Noted  . Visual disturbances 06/16/2018  . 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 Postpartum care following cesarean delivery (7/31) 02/02/2017  . S/P repeat low transverse C-section 02/02/2017  . Pregnancy 08/02/2013  . Mitral regurgitation 11/11/2011  . Patent foramen ovale 11/11/2011    Past Surgical History:  Procedure Laterality Date  . CESAREAN SECTION    . CESAREAN SECTION N/A 02/02/2017   Procedure: Repeat CESAREAN SECTION;  Surgeon: Noland FordyceFogleman, Kelly, MD;  [facility];  Service: Obstetrics;  Laterality: N/A;  EDD: 02/09/17 Allergy: Augmentin, Clindamycin  . TONSILLECTOMY AND ADENOIDECTOMY    . WISDOM TOOTH EXTRACTION       OB History    Gravida  2   Para  2   Term  2   Preterm  0   AB  0   Living  2     SAB  0   TAB  0   Ectopic  0   Multiple  0   Live Births  2            Home Medications    Prior to Admission medications   Medication Sig Start Date End Date Taking? Authorizing Provider  aspirin EC 81 MG tablet Take 1 tablet (81 mg total) by mouth daily. 06/17/18 06/17/19  Ramonita LabGouru, Aruna, MD  atorvastatin (LIPITOR) 40 MG tablet Take 1 tablet (40 mg total) by mouth daily. 06/17/18 06/17/19  Ramonita LabGouru, Aruna, MD  diltiazem (DILACOR XR) 120 MG 24 hr capsule Take 120 mg by mouth daily.    [provider]  ibuprofen (ADVIL,MOTRIN) 200 MG tablet Take 200-400 mg by mouth every 6 (six) hours as needed for headache or mild pain.    [provider]  norethindrone (MICRONOR,CAMILA,ERRIN) 0.35 MG tablet Take 1 tablet by mouth daily.    [provider]  Prenatal Vit-Fe Fumarate-FA (PRENATAL MULTIVITAMIN) TABS tablet Take 1 tablet by mouth daily at 12 noon. Patient not taking: Reported on 08/19/2017 06/16/15   Elvina SidleLauenstein, Kurt, MD    Family History Family History  Problem Relation Age of Onset  . Hypertension Father  Social History Social History   Tobacco Use  . Smoking status: Never Smoker  . Smokeless tobacco: Never Used  Substance Use Topics  . Alcohol use: No    Alcohol/week: 0.0 standard drinks    Comment: Rare  . Drug use: No     Allergies   Augmentin [amoxicillin-pot clavulanate] and Clindamycin/lincomycin   Review of Systems Review of Systems 10 Systems reviewed and are negative for acute change except as noted in the HPI.   Physical Exam Updated Vital Signs BP (!) 121/58 (BP Location: Right Arm)   Pulse 84   Temp 98.9 F (37.2 C) (Oral)   Resp 18   Ht 5\' 4"  (1.626 m)   Wt 54.4 kg   SpO2 100%   BMI 20.60 kg/m   Physical Exam Constitutional:      Appearance: She is well-developed.  HENT:     Head: Normocephalic and atraumatic.  Eyes:      Extraocular Movements: Extraocular movements intact.  Neck:     Musculoskeletal: Neck supple.  Cardiovascular:     Rate and Rhythm: Normal rate and regular rhythm.     Heart sounds: Normal heart sounds.  Pulmonary:     Effort: Pulmonary effort is normal.     Breath sounds: Normal breath sounds.     Comments: Well-healing sternotomy wound.  Nearly healed. Abdominal:     General: Bowel sounds are normal. There is no distension.     Palpations: Abdomen is soft.     Tenderness: There is no abdominal tenderness.  Musculoskeletal: Normal range of motion.  Skin:    General: Skin is warm and dry.  Neurological:     Mental Status: She is alert and oriented to person, place, and time.     GCS: GCS eye subscore is 4. GCS verbal subscore is 5. GCS motor subscore is 6.     Coordination: Coordination normal.      ED Treatments / Results  Labs (all labs ordered are listed, but only abnormal results are displayed) Labs Reviewed  BASIC METABOLIC PANEL - Abnormal; Notable for the following components:      Result Value   Potassium 3.2 (*)    CO2 20 (*)    Glucose, Bld 104 (*)    All other components within normal limits  CBC  PROTIME-INR  I-STAT BETA HCG BLOOD, ED ([facility], [facility], [facility] ONLY)    EKG EKG Interpretation  Date/Time:  Sunday February 26 2019 15:37:15 EDT Ventricular Rate:  106 PR Interval:  166 QRS Duration: 152 QT Interval:  384 QTC Calculation: 510 R Axis:   85 Text Interpretation:  Sinus tachycardia Non-specific intra-ventricular conduction block Abnormal ECG diffuse t wave flattening, no STEMI, resolved Freq PVC compared to 06/16/2018 tracing Confirmed by Charlesetta Shanks [phone]) on 02/26/2019 4:57:20 PM   Radiology No results found.  Procedures Procedures (including critical care time)  Medications Ordered in ED Medications  sodium chloride flush (NS) 0.9 % injection 3 mL (has no administration in time range)     Initial Impression / Assessment and Plan / ED Course   I have reviewed the triage vital signs and the nursing notes.  Pertinent labs & imaging results that were available during my care of the patient were reviewed by me and considered in my medical decision making (see chart for details).       Patient is clinically well in appearance.  Blood pressures in the emergency department are stable.  Heart rate has been sinus without dysrhythmia or  ectopy.  Diagnostic studies are stable but patient does have some mild hypokalemia.  I have suggested a short course of supplemental potassium and follow-up regarding this.  She is clinically well and I do not suspect any infectious etiology.  The heart and lung exam are normal.  Patient was not orthostatic.  Blood pressures actually seem to be lower when she is at rest and then appropriately elevate with activity.  She does have follow-up with her EP physician and cardiologist.  At this time I do feel she is stable for discharge with return precautions reviewed.  Final Clinical Impressions(s) / ED Diagnoses   Final diagnoses:  Hypotension, unspecified hypotension type  Palpitation  H/O mitral valve repair  Hypokalemia    ED Discharge Orders    None       Arby BarrettePfeiffer, Vicy Medico, MD 02/26/19 Barry Brunner1935

## 2019-02-27 NOTE — Telephone Encounter (Signed)
Cardiac Rehab Medication Review by a Pharmacist  Does the patient  feel that his/her medications are working for him/her?  Yes - Pt not sure if diltiazem is really working for palpitations  Has the patient been experiencing any side effects to the medications prescribed?  no  Does the patient measure his/her own blood pressure or blood glucose at home?  yes - BP is usually around 90-100s/60 when pt checks at home.   Does the patient have any problems obtaining medications due to transportation or finances?   no  Understanding of regimen: excellent Understanding of indications: excellent Potential of compliance: excellent   Sherren Kerns, PharmD PGY1 Acute Care Pharmacy Resident 02/27/2019 12:39 PM

## 2019-03-03 ENCOUNTER — Telehealth (HOSPITAL_COMMUNITY): Payer: Self-pay | Admitting: *Deleted

## 2019-03-03 NOTE — Telephone Encounter (Signed)
Called and spoke to pt regarding cardiac rehab orientation scheduled for 03/07/19.  Pt with ER visit for hypotension on 02/26/19 her at [facility]. Pt is s/p MV Repair at [facility] in July.  Pt has upcoming appt scheduled 03/07/19 with Dr. Darlys Gales (Cardiology -[facility]) and Dr. Romona Curls [facility]) for QT response to exercise.  Once pt has completed her follow up and is cleared to participate in group exercise she will be rescheduled.  Pt verbalized understanding and is in agreement. Cherre Huger, BSN Cardiac and Training and development officer

## 2019-03-07 ENCOUNTER — Ambulatory Visit (HOSPITAL_COMMUNITY): Payer: 59

## 2019-03-10 ENCOUNTER — Telehealth (HOSPITAL_COMMUNITY): Payer: Self-pay | Admitting: Obstetrics

## 2019-03-15 ENCOUNTER — Encounter (HOSPITAL_COMMUNITY): Payer: 59

## 2019-03-17 ENCOUNTER — Telehealth (HOSPITAL_COMMUNITY): Payer: Self-pay

## 2019-03-17 ENCOUNTER — Encounter (HOSPITAL_COMMUNITY): Payer: 59

## 2019-03-17 NOTE — Telephone Encounter (Signed)
.  Cardiac Rehab Medication Review by a Pharmacist  Does the patient  feel that his/her medications are working for him/her?  yes  Has the patient been experiencing any side effects to the medications prescribed?  no  Does the patient measure his/her own blood pressure or blood glucose at home?  Pt does not have hypertension or diabetes.   Does the patient have any problems obtaining medications due to transportation or finances?   no  Understanding of regimen: excellent Understanding of indications: excellent Potential of compliance: excellent    Pharmacist comments: No issues.    Mirian Capuchin, 03/17/2019 4:27 PM

## 2019-03-20 ENCOUNTER — Encounter (HOSPITAL_COMMUNITY): Payer: 59

## 2019-03-21 ENCOUNTER — Encounter (HOSPITAL_COMMUNITY)
Admission: RE | Admit: 2019-03-21 | Discharge: 2019-03-21 | Disposition: A | Discharge status: Home / Self Care | Payer: 59 | Source: Ambulatory Visit | Attending: Cardiology | Admitting: Cardiology

## 2019-03-21 ENCOUNTER — Encounter (HOSPITAL_COMMUNITY): Payer: Self-pay

## 2019-03-21 ENCOUNTER — Other Ambulatory Visit: Payer: Self-pay

## 2019-03-21 VITALS — BP 90/62 | HR 73 | Temp 98.2°F | Ht 65.0 in | Wt 125.2 lb

## 2019-03-21 DIAGNOSIS — Z9889 Other specified postprocedural states: Secondary | ICD-10-CM | POA: Insufficient documentation

## 2019-03-21 HISTORY — DX: Hypotension, unspecified: I95.9

## 2019-03-21 NOTE — Progress Notes (Signed)
Pulmonary Individual Treatment Plan  Patient Details  Name: Judy [name] MRN: [id number] Date of Birth: 12/16/1985 Referring Provider:     CARDIAC REHAB PHASE II ORIENTATION [facility] 03/21/2019 in [facility]  Referring Provider  Aurea GraffKincaid, Edward, MD [facility]) Armanda Magic[Turner, Traci, MD (coverage)]      Initial Encounter Date:    C[facility]  Date  03/21/19      Visit Diagnosis: 01/17/19 Mitral Valve Repair  Patient's Home Medications on Admission:   Current Outpatient Medications:  .  acetaminophen (TYLENOL) 325 MG tablet, Take 325 mg by mouth every 6 (six) hours as needed for mild pain or headache., Disp: , Rfl:  .  aspirin EC 81 MG tablet, Take 1 tablet (81 mg total) by mouth daily., Disp: 150 tablet, Rfl: 2 .  clopidogrel (PLAVIX) 75 MG tablet, Take 75 mg by mouth daily., Disp: , Rfl:  .  nadolol (CORGARD) 20 MG tablet, Take 20 mg by mouth daily., Disp: , Rfl:  .  norethindrone (MICRONOR,CAMILA,ERRIN) 0.35 MG tablet, Take 1 tablet by mouth daily., Disp: , Rfl:   Past Medical History: Past Medical History:  Diagnosis Date  . Anemia   . GERD (gastroesophageal reflux disease)   . Hypotension   . Migraines   . MVP (mitral valve prolapse)     Tobacco Use: Social History   Tobacco Use  Smoking Status Never Smoker  Smokeless Tobacco Never Used    Labs: Recent Review Advice workerlowsheet Data    Labs for ITP Cardiac and Pulmonary Rehab Latest Ref Rng & Units 06/16/2018   Cholestrol 0 - 200 mg/dL 409167   LDLCALC 0 - 99 mg/dL 811(B103(H)   HDL >14>40 mg/dL 44   Trlycerides <782<150 mg/dL 99      Capillary Blood Glucose: No results found for: GLUCAP   Pulmonary Assessment Scores:  UCSD: Self-administered rating of dyspnea associated with activities of daily living (ADLs) 6-point scale (0 = "not at all" to 5 = "maximal or unable to do because of breathlessness")  Scoring Scores range  from 0 to 120.  Minimally important difference is 5 units  CAT: CAT can identify the health impairment of COPD patients and is better correlated with disease progression.  CAT has a scoring range of zero to 40. The CAT score is classified into four groups of low (less than 10), medium (10 - 20), high (21-30) and very high (31-40) based on the impact level of disease on health status. A CAT score over 10 suggests significant symptoms.  A worsening CAT score could be explained by an exacerbation, poor medication adherence, poor inhaler technique, or progression of COPD or comorbid conditions.  CAT MCID is 2 points  mMRC: mMRC (Modified Medical Research Council) Dyspnea Scale is used to assess the degree of baseline functional disability in patients of respiratory disease due to dyspnea. No minimal important difference is established. A decrease in score of 1 point or greater is considered a positive change.   Pulmonary Function Assessment:   Exercise Target Goals: Exercise Program Goal: Individual exercise prescription set using results from initial 6 min walk test and THRR while considering  patient's activity barriers and safety.   Exercise Prescription Goal: Initial exercise prescription builds to 30-45 minutes a day of aerobic activity, 2-3 days per week.  Home exercise guidelines will be given to patient during program as part of exercise prescription that the participant will acknowledge.  Activity  Barriers & Risk Stratification: Activity Barriers & Cardiac Risk Stratification - 03/21/19 0752      Activity Barriers & Cardiac Risk Stratification   Activity Barriers  None    Cardiac Risk Stratification  Moderate       6 Minute Walk: 6 Minute Walk    Row Name 03/21/19 0800         6 Minute Walk   Phase  Initial     Distance  1522 feet     Walk Time  6 minutes     # of Rest Breaks  0     MPH  2.88     METS  5.21     RPE  8     Perceived Dyspnea   0     VO2 Peak  18.22      Symptoms  No     Resting HR  73 bpm     Resting BP  90/62     Resting Oxygen Saturation   100 %     Exercise Oxygen Saturation  during 6 min walk  100 %     Max Ex. HR  81 bpm     Max Ex. BP  100/72     2 Minute Post BP  90/62        Oxygen Initial Assessment:   Oxygen Re-Evaluation:   Oxygen Discharge (Final Oxygen Re-Evaluation):   Initial Exercise Prescription: Initial Exercise Prescription - 03/21/19 0900      Date of Initial Exercise RX and Referring Provider   Date  03/21/19    Referring Provider  Aurea Graff, MD [facility])   Armanda Magic, MD (coverage)   Expected Discharge Date  05/19/19      Treadmill   MPH  2.8    Grade  1    Minutes  15    METs  3.53      NuStep   Level  4    SPM  85    Minutes  15    METs  3.3      Prescription Details   Frequency (times per week)  3    Duration  Progress to 30 minutes of continuous aerobic without signs/symptoms of physical distress      Intensity   THRR 40-80% of Max Heartrate  74-145    Ratings of Perceived Exertion  11-13    Perceived Dyspnea  0-4      Progression   Progression  Continue to progress workloads to maintain intensity without signs/symptoms of physical distress.      Resistance Training   Training Prescription  Yes    Weight  3lbs    Reps  10-15       Perform Capillary Blood Glucose checks as needed.  Exercise Prescription Changes:   Exercise Comments:   Exercise Goals and Review: Exercise Goals    Row Name 03/21/19 0745             Exercise Goals   Increase Physical Activity  Yes       Intervention  Provide advice, education, support and counseling about physical activity/exercise needs.;Develop an individualized exercise prescription for aerobic and resistive training based on initial evaluation findings, risk stratification, comorbidities and participant's personal goals.       Expected Outcomes  Short Term: Attend rehab on a regular basis to increase amount of physical  activity.;Long Term: Exercising regularly at least 3-5 days a week.;Long Term: Add in home exercise to make exercise part of routine and  to increase amount of physical activity.       Increase Strength and Stamina  Yes       Intervention  Provide advice, education, support and counseling about physical activity/exercise needs.;Develop an individualized exercise prescription for aerobic and resistive training based on initial evaluation findings, risk stratification, comorbidities and participant's personal goals.       Expected Outcomes  Short Term: Increase workloads from initial exercise prescription for resistance, speed, and METs.;Short Term: Perform resistance training exercises routinely during rehab and add in resistance training at home;Long Term: Improve cardiorespiratory fitness, muscular endurance and strength as measured by increased METs and functional capacity (6MWT)       Able to understand and use rate of perceived exertion (RPE) scale  Yes       Intervention  Provide education and explanation on how to use RPE scale       Expected Outcomes  Short Term: Able to use RPE daily in rehab to express subjective intensity level;Long Term:  Able to use RPE to guide intensity level when exercising independently       Knowledge and understanding of Target Heart Rate Range (THRR)  Yes       Intervention  Provide education and explanation of THRR including how the numbers were predicted and where they are located for reference       Expected Outcomes  Short Term: Able to state/look up THRR;Long Term: Able to use THRR to govern intensity when exercising independently;Short Term: Able to use daily as guideline for intensity in rehab       Able to check pulse independently  Yes       Intervention  Provide education and demonstration on how to check pulse in carotid and radial arteries.;Review the importance of being able to check your own pulse for safety during independent exercise       Expected  Outcomes  Short Term: Able to explain why pulse checking is important during independent exercise;Long Term: Able to check pulse independently and accurately       Understanding of Exercise Prescription  Yes       Intervention  Provide education, explanation, and written materials on patient's individual exercise prescription       Expected Outcomes  Short Term: Able to explain program exercise prescription;Long Term: Able to explain home exercise prescription to exercise independently          Exercise Goals Re-Evaluation :   Discharge Exercise Prescription (Final Exercise Prescription Changes):   Nutrition:  Target Goals: Understanding of nutrition guidelines, daily intake of sodium 1500mg , cholesterol 200mg , calories 30% from fat and 7% or less from saturated fats, daily to have 5 or more servings of fruits and vegetables.  Biometrics: Pre Biometrics - 03/21/19 0745      Pre Biometrics   Height  5\' 5"  (1.651 m)    Weight  56.8 kg    Waist Circumference  26 inches    Hip Circumference  38.5 inches    Waist to Hip Ratio  0.68 %    BMI (Calculated)  20.84    Triceps Skinfold  24 mm    % Body Fat  28.8 %    Grip Strength  32 kg    Flexibility  18.25 in    Single Leg Stand  30 seconds        Nutrition Therapy Plan and Nutrition Goals:   Nutrition Assessments:   Nutrition Goals Re-Evaluation:   Nutrition Goals Discharge (Final Nutrition Goals Re-Evaluation):  Psychosocial: Target Goals: Acknowledge presence or absence of significant depression and/or stress, maximize coping skills, provide positive support system. Participant is able to verbalize types and ability to use techniques and skills needed for reducing stress and depression.  Initial Review & Psychosocial Screening: Initial Psych Review & Screening - 03/21/19 0820      Initial Review   Current issues with  None Identified      Family Dynamics   Good Support System?  Yes    Comments  Patient states  she has a very strong support system and denies psychosocial barriers to health management and participation in CR at this time. She will continue to maintain a positive attitude and participate in healthy stress management      Barriers   Psychosocial barriers to participate in program  There are no identifiable barriers or psychosocial needs.      Screening Interventions   Interventions  Encouraged to exercise       Quality of Life Scores: Quality of Life - 03/21/19 0925      Quality of Life   Select  Quality of Life      Quality of Life Scores   Health/Function Pre  27.27 %    Socioeconomic Pre  28 %    Psych/Spiritual Pre  27.17 %    Family Pre  28.8 %    GLOBAL Pre  27.64 %      Scores of 19 and below usually indicate a poorer quality of life in these areas.  A difference of  2-3 points is a clinically meaningful difference.  A difference of 2-3 points in the total score of the Quality of Life Index has been associated with significant improvement in overall quality of life, self-image, physical symptoms, and general health in studies assessing change in quality of life.  PHQ-9: Recent Review Flowsheet Data    Depression screen [facility] 2/9 03/21/2019 09/19/2016 09/08/2016 06/16/2015   Decreased Interest 0 0 0 0   Down, Depressed, Hopeless 0 0 0 0   PHQ - 2 Score 0 0 0 0     Interpretation of Total Score  Total Score Depression Severity:  1-4 = Minimal depression, 5-9 = Mild depression, 10-14 = Moderate depression, 15-19 = Moderately severe depression, 20-27 = Severe depression   Psychosocial Evaluation and Intervention:   Psychosocial Re-Evaluation:   Psychosocial Discharge (Final Psychosocial Re-Evaluation):   Education: Education Goals: Education classes will be provided on a weekly basis, covering required topics. Participant will state understanding/return demonstration of topics presented.  Learning Barriers/Preferences: Learning Barriers/Preferences - 03/21/19  0752      Learning Barriers/Preferences   Learning Barriers  None    Learning Preferences  None       Education Topics: Risk Factor Reduction:  -Group instruction that is supported by a PowerPoint presentation. Instructor discusses the definition of a risk factor, different risk factors for pulmonary disease, and how the heart and lungs work together.     Nutrition for Pulmonary Patient:  -Group instruction provided by PowerPoint slides, verbal discussion, and written materials to support subject matter. The instructor gives an explanation and review of healthy diet recommendations, which includes a discussion on weight management, recommendations for fruit and vegetable consumption, as well as protein, fluid, caffeine, fiber, sodium, sugar, and alcohol. Tips for eating when patients are short of breath are discussed.   Pursed Lip Breathing:  -Group instruction that is supported by demonstration and informational handouts. Instructor discusses the benefits of pursed lip and  diaphragmatic breathing and detailed demonstration on how to preform both.     Oxygen Safety:  -Group instruction provided by PowerPoint, verbal discussion, and written material to support subject matter. There is an overview of "What is Oxygen" and "Why do we need it".  Instructor also reviews how to create a safe environment for oxygen use, the importance of using oxygen as prescribed, and the risks of noncompliance. There is a brief discussion on traveling with oxygen and resources the patient may utilize.   Oxygen Equipment:  -Group instruction provided by [facility] Staff utilizing handouts, written materials, and equipment demonstrations.   Signs and Symptoms:  -Group instruction provided by written material and verbal discussion to support subject matter. Warning signs and symptoms of infection, stroke, and heart attack are reviewed and when to call the physician/911 reinforced. Tips for preventing the spread  of infection discussed.   Advanced Directives:  -Group instruction provided by verbal instruction and written material to support subject matter. Instructor reviews Advanced Directive laws and proper instruction for filling out document.   Pulmonary Video:  -Group video education that reviews the importance of medication and oxygen compliance, exercise, good nutrition, pulmonary hygiene, and pursed lip and diaphragmatic breathing for the pulmonary patient.   Exercise for the Pulmonary Patient:  -Group instruction that is supported by a PowerPoint presentation. Instructor discusses benefits of exercise, core components of exercise, frequency, duration, and intensity of an exercise routine, importance of utilizing pulse oximetry during exercise, safety while exercising, and options of places to exercise outside of rehab.     Pulmonary Medications:  -Verbally interactive group education provided by instructor with focus on inhaled medications and proper administration.   Anatomy and Physiology of the Respiratory System and Intimacy:  -Group instruction provided by PowerPoint, verbal discussion, and written material to support subject matter. Instructor reviews respiratory cycle and anatomical components of the respiratory system and their functions. Instructor also reviews differences in obstructive and restrictive respiratory diseases with examples of each. Intimacy, Sex, and Sexuality differences are reviewed with a discussion on how relationships can change when diagnosed with pulmonary disease. Common sexual concerns are reviewed.   MD DAY -A group question and answer session with a medical doctor that allows participants to ask questions that relate to their pulmonary disease state.   OTHER EDUCATION -Group or individual verbal, written, or video instructions that support the educational goals of the pulmonary rehab program.   Holiday Eating Survival Tips:  -Group instruction  provided by PowerPoint slides, verbal discussion, and written materials to support subject matter. The instructor gives patients tips, tricks, and techniques to help them not only survive but enjoy the holidays despite the onslaught of food that accompanies the holidays.   Knowledge Questionnaire Score: Knowledge Questionnaire Score - 03/21/19 0926      Knowledge Questionnaire Score   Pre Score  21/24       Core Components/Risk Factors/Patient Goals at Admission: Personal Goals and Risk Factors at Admission - 03/21/19 0745      Core Components/Risk Factors/Patient Goals on Admission   Personal Goal Other  Yes    Personal Goal  Be able to run.    Intervention  Provide individualized exercise routine including aerobic training, stretching, and resistance training to help increase stamina, so that patient can return to running.    Expected Outcomes  Patient will achieve increased cardiorespiratory fitness. Patient will be able to add running to her exercise routine.       Core Components/Risk Factors/Patient  Goals Review:    Core Components/Risk Factors/Patient Goals at Discharge (Final Review):    ITP Comments: ITP Comments    Row Name 03/21/19 0731           ITP Comments  Medical Director- Dr. Armanda Magic, MD          Comments: Patient attended orientation on 03/21/2019 to review rules and guidelines for program.  Completed 6 minute walk test, Intitial ITP, and exercise prescription.  VSS. Telemetry-NSR. Asymptomatic. Safety measures and social distancing in place per CDC guidelines.

## 2019-03-22 ENCOUNTER — Encounter (HOSPITAL_COMMUNITY): Payer: 59

## 2019-03-24 ENCOUNTER — Encounter (HOSPITAL_COMMUNITY): Payer: 59

## 2019-03-27 ENCOUNTER — Encounter (HOSPITAL_COMMUNITY): Payer: 59

## 2019-03-27 ENCOUNTER — Encounter (HOSPITAL_COMMUNITY)
Admission: RE | Admit: 2019-03-27 | Discharge: 2019-03-27 | Disposition: A | Discharge status: Home / Self Care | Payer: 59 | Source: Ambulatory Visit | Attending: Cardiology | Admitting: Cardiology

## 2019-03-27 ENCOUNTER — Other Ambulatory Visit: Payer: Self-pay

## 2019-03-27 DIAGNOSIS — Z9889 Other specified postprocedural states: Secondary | ICD-10-CM | POA: Diagnosis not present

## 2019-03-27 NOTE — Progress Notes (Addendum)
Daily Session Note  Patient Details  Name: Judy [name] MRN: [id number] Date of Birth: 03/16/1986 Referring Provider:     CARDIAC REHAB PHASE II ORIENTATION from 03/21/2019 in [facility]  Referring Provider  Aurea Graff, MD [facility]) Armanda Magic, MD (coverage)]      Encounter Date: 03/27/2019  Check In: Session Check In - 03/27/19 1521      Check-In   Supervising physician immediately available to respond to emergencies  [facility] immediately available    Physician(s)  Dr. Dayna Barker    Location  [facility]    Staff Present  Cristy Hilts, MS, ACSM CEP, Exercise Physiologist;Keshona Kartes Suzie Portela, RN, BSN    Virtual Visit  No    Medication changes reported      No    Fall or balance concerns reported     No    Tobacco Cessation  No Change    Warm-up and Cool-down  Performed on first and last piece of equipment    Resistance Training Performed  Yes    VAD Patient?  No    PAD/SET Patient?  No      Pain Assessment   Currently in Pain?  No/denies    Multiple Pain Sites  No       Capillary Blood Glucose: No results found for this or any previous visit (from the past 24 hour(s)).  Exercise Prescription Changes - 03/27/19 1600      Response to Exercise   Blood Pressure (Admit)  118/64    Blood Pressure (Exercise)  120/62    Blood Pressure (Exit)  102/60    Heart Rate (Admit)  77 bpm    Heart Rate (Exercise)  102 bpm    Heart Rate (Exit)  84 bpm    Rating of Perceived Exertion (Exercise)  12    Symptoms  none    Comments  Patient tolerated 1st exercise session well without symptoms.    Duration  Progress to 30 minutes of  aerobic without signs/symptoms of physical distress    Intensity  THRR unchanged      Progression   Progression  Continue to progress workloads to maintain intensity without signs/symptoms of physical distress.    Average METs  2.9      Resistance Training   Training Prescription  Yes    Weight   3lbs    Reps  10-15    Time  10 Minutes      Interval Training   Interval Training  No      Treadmill   MPH  2.8    Grade  1    Minutes  15    METs  3.53      NuStep   Level  3    SPM  85    Minutes  15    METs  2.3       Social History   Tobacco Use  Smoking Status Never Smoker  Smokeless Tobacco Never Used    Goals Met:  Exercise tolerated well No report of cardiac concerns or symptoms Strength training completed today  Goals Unmet:  Not Applicable  Comments: Pt started cardiac rehab today.  Pt tolerated light exercise without difficulty. VSS, telemetry-NSR, asymptomatic.  Medication list reconciled. Pt denies barriers to medicaiton compliance.  PSYCHOSOCIAL ASSESSMENT:  PHQ-0. Pt exhibits positive coping skills, hopeful outlook with supportive family. No psychosocial needs identified at this time, no psychosocial interventions necessary. Pt oriented to exercise equipment and routine.  Understanding verbalized.   Dr. Armanda Magic is Medical Director for Cardiac Rehab at [facility].

## 2019-03-29 ENCOUNTER — Other Ambulatory Visit: Payer: Self-pay

## 2019-03-29 ENCOUNTER — Encounter (HOSPITAL_COMMUNITY)
Admission: RE | Admit: 2019-03-29 | Discharge: 2019-03-29 | Disposition: A | Discharge status: Home / Self Care | Payer: 59 | Source: Ambulatory Visit | Attending: Cardiology | Admitting: Cardiology

## 2019-03-29 ENCOUNTER — Encounter (HOSPITAL_COMMUNITY): Payer: 59

## 2019-03-29 DIAGNOSIS — Z9889 Other specified postprocedural states: Secondary | ICD-10-CM | POA: Diagnosis not present

## 2019-03-31 ENCOUNTER — Encounter (HOSPITAL_COMMUNITY): Payer: 59

## 2019-03-31 ENCOUNTER — Encounter (HOSPITAL_COMMUNITY)
Admission: RE | Admit: 2019-03-31 | Discharge: 2019-03-31 | Disposition: A | Discharge status: Home / Self Care | Payer: 59 | Source: Ambulatory Visit | Attending: Cardiology | Admitting: Cardiology

## 2019-03-31 ENCOUNTER — Other Ambulatory Visit: Payer: Self-pay

## 2019-03-31 DIAGNOSIS — Z9889 Other specified postprocedural states: Secondary | ICD-10-CM

## 2019-04-03 ENCOUNTER — Encounter (HOSPITAL_COMMUNITY): Payer: 59

## 2019-04-03 ENCOUNTER — Other Ambulatory Visit: Payer: Self-pay

## 2019-04-03 ENCOUNTER — Encounter (HOSPITAL_COMMUNITY)
Admission: RE | Admit: 2019-04-03 | Discharge: 2019-04-03 | Disposition: A | Discharge status: Home / Self Care | Payer: 59 | Source: Ambulatory Visit | Attending: Cardiology | Admitting: Cardiology

## 2019-04-03 DIAGNOSIS — Z9889 Other specified postprocedural states: Secondary | ICD-10-CM

## 2019-04-04 NOTE — Progress Notes (Signed)
Reviewed home exercise guidelines with patient including endpoints, temperature precautions, target heart rate and rate of perceived exertion. Pt is walking 20-30 minutes and working out to [employer], 2 days/week as her mode of home exercise. Pt voices understanding of instructions given.  Sol Passer, MS, ACSM CEP

## 2019-04-05 ENCOUNTER — Other Ambulatory Visit: Payer: Self-pay

## 2019-04-05 ENCOUNTER — Encounter (HOSPITAL_COMMUNITY)
Admission: RE | Admit: 2019-04-05 | Discharge: 2019-04-05 | Disposition: A | Discharge status: Home / Self Care | Payer: 59 | Source: Ambulatory Visit | Attending: Cardiology | Admitting: Cardiology

## 2019-04-05 ENCOUNTER — Encounter (HOSPITAL_COMMUNITY): Payer: 59

## 2019-04-05 DIAGNOSIS — Z9889 Other specified postprocedural states: Secondary | ICD-10-CM

## 2019-04-07 ENCOUNTER — Encounter (HOSPITAL_COMMUNITY)
Admission: RE | Admit: 2019-04-07 | Discharge: 2019-04-07 | Disposition: A | Discharge status: Home / Self Care | Payer: 59 | Source: Ambulatory Visit | Attending: Cardiology | Admitting: Cardiology

## 2019-04-07 ENCOUNTER — Encounter (HOSPITAL_COMMUNITY): Payer: 59

## 2019-04-07 ENCOUNTER — Other Ambulatory Visit: Payer: Self-pay

## 2019-04-07 DIAGNOSIS — Z9889 Other specified postprocedural states: Secondary | ICD-10-CM | POA: Insufficient documentation

## 2019-04-10 ENCOUNTER — Encounter (HOSPITAL_COMMUNITY): Payer: 59

## 2019-04-10 ENCOUNTER — Encounter (HOSPITAL_COMMUNITY)
Admission: RE | Admit: 2019-04-10 | Discharge: 2019-04-10 | Disposition: A | Discharge status: Home / Self Care | Payer: 59 | Source: Ambulatory Visit | Attending: Cardiology | Admitting: Cardiology

## 2019-04-10 ENCOUNTER — Other Ambulatory Visit: Payer: Self-pay

## 2019-04-10 DIAGNOSIS — Z9889 Other specified postprocedural states: Secondary | ICD-10-CM | POA: Diagnosis not present

## 2019-04-12 ENCOUNTER — Encounter (HOSPITAL_COMMUNITY): Payer: 59

## 2019-04-12 ENCOUNTER — Other Ambulatory Visit: Payer: Self-pay

## 2019-04-12 ENCOUNTER — Encounter (HOSPITAL_COMMUNITY)
Admission: RE | Admit: 2019-04-12 | Discharge: 2019-04-12 | Disposition: A | Discharge status: Home / Self Care | Payer: 59 | Source: Ambulatory Visit | Attending: Cardiology | Admitting: Cardiology

## 2019-04-12 DIAGNOSIS — Z9889 Other specified postprocedural states: Secondary | ICD-10-CM | POA: Diagnosis not present

## 2019-04-14 ENCOUNTER — Encounter (HOSPITAL_COMMUNITY)
Admission: RE | Admit: 2019-04-14 | Discharge: 2019-04-14 | Disposition: A | Discharge status: Home / Self Care | Payer: 59 | Source: Ambulatory Visit | Attending: Cardiology | Admitting: Cardiology

## 2019-04-14 ENCOUNTER — Other Ambulatory Visit: Payer: Self-pay

## 2019-04-14 ENCOUNTER — Encounter (HOSPITAL_COMMUNITY): Payer: 59

## 2019-04-14 DIAGNOSIS — Z9889 Other specified postprocedural states: Secondary | ICD-10-CM | POA: Diagnosis not present

## 2019-04-17 ENCOUNTER — Encounter (HOSPITAL_COMMUNITY): Payer: 59

## 2019-04-17 ENCOUNTER — Other Ambulatory Visit: Payer: Self-pay

## 2019-04-17 ENCOUNTER — Encounter (HOSPITAL_COMMUNITY)
Admission: RE | Admit: 2019-04-17 | Discharge: 2019-04-17 | Disposition: A | Discharge status: Home / Self Care | Payer: 59 | Source: Ambulatory Visit | Attending: Cardiology | Admitting: Cardiology

## 2019-04-17 DIAGNOSIS — Z9889 Other specified postprocedural states: Secondary | ICD-10-CM

## 2019-04-19 ENCOUNTER — Encounter (HOSPITAL_COMMUNITY): Payer: 59

## 2019-04-19 ENCOUNTER — Other Ambulatory Visit: Payer: Self-pay

## 2019-04-19 ENCOUNTER — Encounter (HOSPITAL_COMMUNITY)
Admission: RE | Admit: 2019-04-19 | Discharge: 2019-04-19 | Disposition: A | Discharge status: Home / Self Care | Payer: 59 | Source: Ambulatory Visit | Attending: Cardiology | Admitting: Cardiology

## 2019-04-19 DIAGNOSIS — Z9889 Other specified postprocedural states: Secondary | ICD-10-CM | POA: Diagnosis not present

## 2019-04-20 NOTE — Progress Notes (Signed)
Cardiac Individual Treatment Plan  Patient Details  Name: Judy [name] MRN: [id number] Date of Birth: October 15, 1985 Referring Provider:     CARDIAC REHAB PHASE II ORIENTATION from 03/21/2019 in [facility] CARDIAC REHAB  Referring Provider  Aurea Graff, MD [facility]) Armanda Magic, MD (coverage)]      Initial Encounter Date:    CARDIAC REHAB PHASE II ORIENTATION from 03/21/2019 in [facility] CARDIAC REHAB  Date  03/21/19      Visit Diagnosis: 01/17/19 Mitral Valve Repair  Patient's Home Medications on Admission:  Current Outpatient Medications:  .  acetaminophen (TYLENOL) 325 MG tablet, Take 325 mg by mouth every 6 (six) hours as needed for mild pain or headache., Disp: , Rfl:  .  aspirin EC 81 MG tablet, Take 1 tablet (81 mg total) by mouth daily., Disp: 150 tablet, Rfl: 2 .  clopidogrel (PLAVIX) 75 MG tablet, Take 75 mg by mouth daily., Disp: , Rfl:  .  nadolol (CORGARD) 20 MG tablet, Take 20 mg by mouth daily., Disp: , Rfl:  .  norethindrone (MICRONOR,CAMILA,ERRIN) 0.35 MG tablet, Take 1 tablet by mouth daily., Disp: , Rfl:   Past Medical History: Past Medical History:  Diagnosis Date  . Anemia   . GERD (gastroesophageal reflux disease)   . Hypotension   . Migraines   . MVP (mitral valve prolapse)     Tobacco Use: Social History   Tobacco Use  Smoking Status Never Smoker  Smokeless Tobacco Never Used    Labs: Recent Review Advice worker    Labs for ITP Cardiac and Pulmonary Rehab Latest Ref Rng & Units 06/16/2018   Cholestrol 0 - 200 mg/dL 810   LDLCALC 0 - 99 mg/dL 175(Z)   HDL >02 mg/dL 44   Trlycerides <585 mg/dL 99      Capillary Blood Glucose: No results found for: GLUCAP   Exercise Target Goals: Exercise Program Goal: Individual exercise prescription set using results from initial 6 min walk test and THRR while considering  patient's activity barriers and safety.   Exercise Prescription Goal: Starting with  aerobic activity 30 plus minutes a day, 3 days per week for initial exercise prescription. Provide home exercise prescription and guidelines that participant acknowledges understanding prior to discharge.  Activity Barriers & Risk Stratification: Activity Barriers & Cardiac Risk Stratification - 03/21/19 0752      Activity Barriers & Cardiac Risk Stratification   Activity Barriers  None    Cardiac Risk Stratification  Moderate       6 Minute Walk: 6 Minute Walk    Row Name 03/21/19 0800         6 Minute Walk   Phase  Initial     Distance  1522 feet     Walk Time  6 minutes     # of Rest Breaks  0     MPH  2.88     METS  5.21     RPE  8     Perceived Dyspnea   0     VO2 Peak  18.22     Symptoms  No     Resting HR  73 bpm     Resting BP  90/62     Resting Oxygen Saturation   100 %     Exercise Oxygen Saturation  during 6 min walk  100 %     Max Ex. HR  81 bpm     Max Ex. BP  100/72  2 Minute Post BP  90/62        Oxygen Initial Assessment:   Oxygen Re-Evaluation:   Oxygen Discharge (Final Oxygen Re-Evaluation):   Initial Exercise Prescription: Initial Exercise Prescription - 03/21/19 0900      Date of Initial Exercise RX and Referring Provider   Date  03/21/19    Referring Provider  Aurea GraffKincaid, Edward, MD [facility]   Armanda Magicurner, Traci, MD (coverage)   Expected Discharge Date  05/19/19      Treadmill   MPH  2.8    Grade  1    Minutes  15    METs  3.53      NuStep   Level  4    SPM  85    Minutes  15    METs  3.3      Prescription Details   Frequency (times per week)  3    Duration  Progress to 30 minutes of continuous aerobic without signs/symptoms of physical distress      Intensity   THRR 40-80% of Max Heartrate  74-145    Ratings of Perceived Exertion  11-13    Perceived Dyspnea  0-4      Progression   Progression  Continue to progress workloads to maintain intensity without signs/symptoms of physical distress.      Resistance Training    Training Prescription  Yes    Weight  3lbs    Reps  10-15       Perform Capillary Blood Glucose checks as needed.  Exercise Prescription Changes: Exercise Prescription Changes    Row Name 03/27/19 1600 04/03/19 1516 04/17/19 1517         Response to Exercise   Blood Pressure (Admit)  118/64  108/66  94/64     Blood Pressure (Exercise)  120/62  118/74  90/50     Blood Pressure (Exit)  102/60  96/68  90/60     Heart Rate (Admit)  77 bpm  66 bpm  78 bpm     Heart Rate (Exercise)  102 bpm  110 bpm  113 bpm     Heart Rate (Exit)  84 bpm  77 bpm  88 bpm     Rating of Perceived Exertion (Exercise)  12  12  11      Symptoms  none  none  none     Comments  Patient tolerated 1st exercise session well without symptoms.  -  -     Duration  Progress to 30 minutes of  aerobic without signs/symptoms of physical distress  Progress to 30 minutes of  aerobic without signs/symptoms of physical distress  Progress to 30 minutes of  aerobic without signs/symptoms of physical distress     Intensity  THRR unchanged  THRR unchanged  THRR unchanged       Progression   Progression  Continue to progress workloads to maintain intensity without signs/symptoms of physical distress.  Continue to progress workloads to maintain intensity without signs/symptoms of physical distress.  Continue to progress workloads to maintain intensity without signs/symptoms of physical distress.     Average METs  2.9  3.2  4.1       Resistance Training   Training Prescription  Yes  Yes  Yes     Weight  3lbs  3lbs  3lbs     Reps  10-15  10-15  10-15     Time  10 Minutes  10 Minutes  10 Minutes       Interval Training  Interval Training  No  No  No       Treadmill   MPH  2.8  2.8  3     Grade  Minutes  METs  3.53  3.92  4.12       NuStep   Level  SPM  85  85  85     Minutes  METs  2.3  2.4  4       Home Exercise Plan   Plans to continue exercise at  -  Home  (comment) Walking, aerobics videos  Home (comment) Walking, aerobics videos     Frequency  -  Add 2 additional days to program exercise sessions.  Add 2 additional days to program exercise sessions.     Initial Home Exercises Provided  -  04/03/19  04/03/19        Exercise Comments: Exercise Comments    Row Name 03/27/19 1605 04/03/19 1544 04/05/19 1555 04/17/19 1530     Exercise Comments  Patient tolerated 1st session of exercise well without symptoms.  Reviewed home exercise guidelines and goals with patient.  Reviewed METs and goals with patient. Increased workload on treadmill to help build cardiorespiratory fitness with a goal to return to jogging.  Reviewed METs and goals with patient.       Exercise Goals and Review: Exercise Goals    Row Name 03/21/19 0745             Exercise Goals   Increase Physical Activity  Yes       Intervention  Provide advice, education, support and counseling about physical activity/exercise needs.;Develop an individualized exercise prescription for aerobic and resistive training based on initial evaluation findings, risk stratification, comorbidities and participant's personal goals.       Expected Outcomes  Short Term: Attend rehab on a regular basis to increase amount of physical activity.;Long Term: Exercising regularly at least 3-5 days a week.;Long Term: Add in home exercise to make exercise part of routine and to increase amount of physical activity.       Increase Strength and Stamina  Yes       Intervention  Provide advice, education, support and counseling about physical activity/exercise needs.;Develop an individualized exercise prescription for aerobic and resistive training based on initial evaluation findings, risk stratification, comorbidities and participant's personal goals.       Expected Outcomes  Short Term: Increase workloads from initial exercise prescription for resistance, speed, and METs.;Short Term: Perform resistance training  exercises routinely during rehab and add in resistance training at home;Long Term: Improve cardiorespiratory fitness, muscular endurance and strength as measured by increased METs and functional capacity ( )       Able to understand and use rate of perceived exertion (RPE) scale  Yes       Intervention  Provide education and explanation on how to use RPE scale       Expected Outcomes  Short Term: Able to use RPE daily in rehab to express subjective intensity level;Long Term:  Able to use RPE to guide intensity level when exercising independently       Knowledge and understanding of Target Heart Rate Range (THRR)  Yes       Intervention  Provide education and explanation of THRR including how the numbers were predicted  and where they are located for reference       Expected Outcomes  Short Term: Able to state/look up THRR;Long Term: Able to use THRR to govern intensity when exercising independently;Short Term: Able to use daily as guideline for intensity in rehab       Able to check pulse independently  Yes       Intervention  Provide education and demonstration on how to check pulse in carotid and radial arteries.;Review the importance of being able to check your own pulse for safety during independent exercise       Expected Outcomes  Short Term: Able to explain why pulse checking is important during independent exercise;Long Term: Able to check pulse independently and accurately       Understanding of Exercise Prescription  Yes       Intervention  Provide education, explanation, and written materials on patient's individual exercise prescription       Expected Outcomes  Short Term: Able to explain program exercise prescription;Long Term: Able to explain home exercise prescription to exercise independently          Exercise Goals Re-Evaluation : Exercise Goals Re-Evaluation    Row Name 03/27/19 1605 04/03/19 1544 04/17/19 1530         Exercise Goal Re-Evaluation   Exercise Goals Review   Able to understand and use rate of perceived exertion (RPE) scale;Knowledge and understanding of Target Heart Rate Range (THRR);Increase Physical Activity  Able to understand and use rate of perceived exertion (RPE) scale;Knowledge and understanding of Target Heart Rate Range (THRR);Increase Physical Activity;Able to check pulse independently;Understanding of Exercise Prescription  Able to understand and use rate of perceived exertion (RPE) scale;Knowledge and understanding of Target Heart Rate Range (THRR);Increase Physical Activity;Able to check pulse independently;Understanding of Exercise Prescription;Increase Strength and Stamina     Comments  Patient able to understand and use RPE scale appropriately. Reviewed THRR for exercise.  Reviewed home exericse guidelines and patient is walking 20-30 minutes and/or working out to aerobics video 2 days/week. Pt has a heart rate watch to check her pulse. Increased incline on treadmill to help build stamina.  Patient is exercising to aerobics videos at mild-moderate intensity. Pt feels like her stamina is better.     Expected Outcomes  Increase workloads as tolerated to help improve cardiorespiratory fitness.  Patient will exercise 30 minutes, at least 2 days/week in addition to exercise at cardiac rehab to help build stamina. Increase workloads at cardiac rehab as tolerated.  Continue current exercise routine to help achieve personal health and fitness goals.         Discharge Exercise Prescription (Final Exercise Prescription Changes): Exercise Prescription Changes - 04/17/19 1517      Response to Exercise   Blood Pressure (Admit)  94/64    Blood Pressure (Exercise)  90/50    Blood Pressure (Exit)  90/60    Heart Rate (Admit)  78 bpm    Heart Rate (Exercise)  113 bpm    Heart Rate (Exit)  88 bpm    Rating of Perceived Exertion (Exercise)  11    Symptoms  none    Duration  Progress to 30 minutes of  aerobic without signs/symptoms of physical distress     Intensity  THRR unchanged      Progression   Progression  Continue to progress workloads to maintain intensity without signs/symptoms of physical distress.    Average METs  4.1      Resistance Training   Training Prescription  Yes    Weight  3lbs    Reps  10-15    Time  10 Minutes      Interval Training   Interval Training  No      Treadmill   MPH  3    Grade  2    Minutes  15    METs  4.12      NuStep   Level  4    SPM  85    Minutes  15    METs  4      Home Exercise Plan   Plans to continue exercise at  Home (comment)   Walking, aerobics videos   Frequency  Add 2 additional days to program exercise sessions.    Initial Home Exercises Provided  04/03/19       Nutrition:  Target Goals: Understanding of nutrition guidelines, daily intake of sodium 1500mg , cholesterol 200mg , calories 30% from fat and 7% or less from saturated fats, daily to have 5 or more servings of fruits and vegetables.  Biometrics: Pre Biometrics - 03/21/19 0745      Pre Biometrics   Height   (1.651 m)    Weight  56.8 kg    Waist Circumference  26 inches    Hip Circumference  38.5 inches    Waist to Hip Ratio  0.68 %    BMI (Calculated)  20.84    Triceps Skinfold  24 mm    % Body Fat  28.8 %    Grip Strength  32 kg    Flexibility  18.25 in    Single Leg Stand  30 seconds        Nutrition Therapy Plan and Nutrition Goals:   Nutrition Assessments:   Nutrition Goals Re-Evaluation:   Nutrition Goals Discharge (Final Nutrition Goals Re-Evaluation):   Psychosocial: Target Goals: Acknowledge presence or absence of significant depression and/or stress, maximize coping skills, provide positive support system. Participant is able to verbalize types and ability to use techniques and skills needed for reducing stress and depression.  Initial Review & Psychosocial Screening: Initial Psych Review & Screening - 03/21/19 0820      Initial Review   Current issues with  None  Identified      Family Dynamics   Good Support System?  Yes    Comments  Patient states she has a very strong support system and denies psychosocial barriers to health management and participation in CR at this time. She will continue to maintain a positive attitude and participate in healthy stress management      Barriers   Psychosocial barriers to participate in program  There are no identifiable barriers or psychosocial needs.      Screening Interventions   Interventions  Encouraged to exercise       Quality of Life Scores: Quality of Life - 03/21/19 0925      Quality of Life   Select  Quality of Life      Quality of Life Scores   Health/Function Pre  27.27 %    Socioeconomic Pre  28 %    Psych/Spiritual Pre  27.17 %    Family Pre  28.8 %    GLOBAL Pre  27.64 %      Scores of 19 and below usually indicate a poorer quality of life in these areas.  A difference of  2-3 points is a clinically meaningful difference.  A difference of 2-3 points in the total score of the Quality of  Life Index has been associated with significant improvement in overall quality of life, self-image, physical symptoms, and general health in studies assessing change in quality of life.  PHQ-9: Recent Review Flowsheet Data    Depression screen [facility] 2/9 03/27/2019 03/21/2019 09/19/2016 09/08/2016 06/16/2015   Decreased Interest 0 0 0 0 0   Down, Depressed, Hopeless 0 0 0 0 0   PHQ - 2 Score 0 0 0 0 0     Interpretation of Total Score  Total Score Depression Severity:  1-4 = Minimal depression, 5-9 = Mild depression, 10-14 = Moderate depression, 15-19 = Moderately severe depression, 20-27 = Severe depression   Psychosocial Evaluation and Intervention: Psychosocial Evaluation - 03/27/19 1630      Psychosocial Evaluation & Interventions   Continue Psychosocial Services   No Follow up required       Psychosocial Re-Evaluation: Psychosocial Re-Evaluation    Row Name 04/14/19 1206              Psychosocial Re-Evaluation   Current issues with  None Identified       Comments  Kenyon denies psychosocial barriers to participation in CR and self health management. She continues to utilize her strong support system which includes family, friends, and healthcare team.       Expected Outcomes  Massie will continue to have a positive outlook and healthy stress management. She will utilize support system if needs arise.       Interventions  Encouraged to attend Cardiac Rehabilitation for the exercise       Continue Psychosocial Services   No Follow up required          Psychosocial Discharge (Final Psychosocial Re-Evaluation): Psychosocial Re-Evaluation - 04/14/19 1206      Psychosocial Re-Evaluation   Current issues with  None Identified    Comments  Elzabeth denies psychosocial barriers to participation in CR and self health management. She continues to utilize her strong support system which includes family, friends, and healthcare team.    Expected Outcomes  Estela will continue to have a positive outlook and healthy stress management. She will utilize support system if needs arise.    Interventions  Encouraged to attend Cardiac Rehabilitation for the exercise    Continue Psychosocial Services   No Follow up required       Vocational Rehabilitation: Provide vocational rehab assistance to qualifying candidates.   Vocational Rehab Evaluation & Intervention: Vocational Rehab - 03/21/19 0819      Initial Vocational Rehab Evaluation & Intervention   Assessment shows need for Vocational Rehabilitation  No       Education: Education Goals: Education classes will be provided on a weekly basis, covering required topics. Participant will state understanding/return demonstration of topics presented.  Learning Barriers/Preferences: Learning Barriers/Preferences - 03/21/19 0752      Learning Barriers/Preferences   Learning Barriers  None    Learning Preferences  None        Education Topics: Hypertension, Hypertension Reduction -Define heart disease and high blood pressure. Discus how high blood pressure affects the body and ways to reduce high blood pressure.   Exercise and Your Heart -Discuss why it is important to exercise, the FITT principles of exercise, normal and abnormal responses to exercise, and how to exercise safely.   Angina -Discuss definition of angina, causes of angina, treatment of angina, and how to decrease risk of having angina.   Cardiac Medications -Review what the following cardiac medications are used for, how they affect the body,  and side effects that may occur when taking the medications.  Medications include Aspirin, Beta blockers, calcium channel blockers, ACE Inhibitors, angiotensin receptor blockers, diuretics, digoxin, and antihyperlipidemics.   Congestive Heart Failure -Discuss the definition of CHF, how to live with CHF, the signs and symptoms of CHF, and how keep track of weight and sodium intake.   Heart Disease and Intimacy -Discus the effect sexual activity has on the heart, how changes occur during intimacy as we age, and safety during sexual activity.   Smoking Cessation / COPD -Discuss different methods to quit smoking, the health benefits of quitting smoking, and the definition of COPD.   Nutrition I: Fats -Discuss the types of cholesterol, what cholesterol does to the heart, and how cholesterol levels can be controlled.   Nutrition II: Labels -Discuss the different components of food labels and how to read food label   Heart Parts/Heart Disease and PAD -Discuss the anatomy of the heart, the pathway of blood circulation through the heart, and these are affected by heart disease.   Stress I: Signs and Symptoms -Discuss the causes of stress, how stress may lead to anxiety and depression, and ways to limit stress.   Stress II: Relaxation -Discuss different types of relaxation techniques to limit  stress.   Warning Signs of Stroke / TIA -Discuss definition of a stroke, what the signs and symptoms are of a stroke, and how to identify when someone is having stroke.   Knowledge Questionnaire Score: Knowledge Questionnaire Score - 03/21/19 0926      Knowledge Questionnaire Score   Pre Score  21/24       Core Components/Risk Factors/Patient Goals at Admission: Personal Goals and Risk Factors at Admission - 03/21/19 0745      Core Components/Risk Factors/Patient Goals on Admission   Personal Goal Other  Yes    Personal Goal  Be able to run.    Intervention  Provide individualized exercise routine including aerobic training, stretching, and resistance training to help increase stamina, so that patient can return to running.    Expected Outcomes  Patient will achieve increased cardiorespiratory fitness. Patient will be able to add running to her exercise routine.       Core Components/Risk Factors/Patient Goals Review:  Goals and Risk Factor Review    Row Name 03/27/19 1631 04/14/19 1208           Core Components/Risk Factors/Patient Goals Review   Review  Patient has no CAD risk factors. She is very eager to participate in CR  Patient has no CAD risk factors. She continues to be very eager to participate in CR.      Expected Outcomes  Patient will continue to participate in CR to increase her stamina and strength and meet her personal goal of being able to run for exercise again.  Patient will continue to participate in CR to increase her stamina and strength and meet her personal goal of being able to run for exercise again.         Core Components/Risk Factors/Patient Goals at Discharge (Final Review):  Goals and Risk Factor Review - 04/14/19 1208      Core Components/Risk Factors/Patient Goals Review   Review  Patient has no CAD risk factors. She continues to be very eager to participate in CR.    Expected Outcomes  Patient will continue to participate in CR to increase  her stamina and strength and meet her personal goal of being able to run for exercise again.  ITP Comments: ITP Comments    Row Name 03/21/19 0731 03/27/19 1627 04/14/19 1159       ITP Comments  Medical Director- Dr. Armanda Magic, MD  Ms. Fetting started exercise today in CR and tolerated well  30 day ITP review: Jamirra continues to do well in CR. She is tolerating work load increases and her attendance is perfect. She is also exercising at home. She is eager to learn risk factor modification. She has a strong support system composed of family, friends, and health care providers.        Comments: See ITP comments

## 2019-04-21 ENCOUNTER — Other Ambulatory Visit: Payer: Self-pay

## 2019-04-21 ENCOUNTER — Encounter (HOSPITAL_COMMUNITY): Payer: 59

## 2019-04-21 ENCOUNTER — Encounter (HOSPITAL_COMMUNITY)
Admission: RE | Admit: 2019-04-21 | Discharge: 2019-04-21 | Disposition: A | Discharge status: Home / Self Care | Payer: 59 | Source: Ambulatory Visit | Attending: Cardiology | Admitting: Cardiology

## 2019-04-21 DIAGNOSIS — Z9889 Other specified postprocedural states: Secondary | ICD-10-CM

## 2019-04-24 ENCOUNTER — Encounter (HOSPITAL_COMMUNITY): Payer: 59

## 2019-04-24 ENCOUNTER — Encounter (HOSPITAL_COMMUNITY)
Admission: RE | Admit: 2019-04-24 | Discharge: 2019-04-24 | Disposition: A | Discharge status: Home / Self Care | Payer: 59 | Source: Ambulatory Visit | Attending: Cardiology | Admitting: Cardiology

## 2019-04-24 ENCOUNTER — Other Ambulatory Visit: Payer: Self-pay

## 2019-04-24 DIAGNOSIS — Z9889 Other specified postprocedural states: Secondary | ICD-10-CM

## 2019-04-26 ENCOUNTER — Other Ambulatory Visit: Payer: Self-pay

## 2019-04-26 ENCOUNTER — Encounter (HOSPITAL_COMMUNITY): Payer: 59

## 2019-04-26 ENCOUNTER — Encounter (HOSPITAL_COMMUNITY)
Admission: RE | Admit: 2019-04-26 | Discharge: 2019-04-26 | Disposition: A | Discharge status: Home / Self Care | Payer: 59 | Source: Ambulatory Visit | Attending: Cardiology | Admitting: Cardiology

## 2019-04-26 DIAGNOSIS — Z9889 Other specified postprocedural states: Secondary | ICD-10-CM | POA: Diagnosis not present

## 2019-04-28 ENCOUNTER — Other Ambulatory Visit: Payer: Self-pay

## 2019-04-28 ENCOUNTER — Encounter (HOSPITAL_COMMUNITY): Payer: 59

## 2019-04-28 ENCOUNTER — Telehealth (HOSPITAL_COMMUNITY): Payer: Self-pay

## 2019-04-28 ENCOUNTER — Encounter (HOSPITAL_COMMUNITY)
Admission: RE | Admit: 2019-04-28 | Discharge: 2019-04-28 | Disposition: A | Discharge status: Home / Self Care | Payer: 59 | Source: Ambulatory Visit | Attending: Cardiology | Admitting: Cardiology

## 2019-04-28 DIAGNOSIS — Z9889 Other specified postprocedural states: Secondary | ICD-10-CM

## 2019-04-28 NOTE — Telephone Encounter (Signed)
Pt called and wanted to know how long she had left in the cardiac rehab program I left pt know that she graduates from the program on 05/19/2019. Pt understood.

## 2019-05-01 ENCOUNTER — Telehealth (HOSPITAL_COMMUNITY): Payer: Self-pay

## 2019-05-01 ENCOUNTER — Encounter (HOSPITAL_COMMUNITY): Payer: 59

## 2019-05-01 NOTE — Telephone Encounter (Signed)
Incoming call from Ms. Judy [name] to discuss her exposure to a symptomatic suspected Covid 70 co-worker on Friday 04/28/2019. Per Ms. Judy [name], her co-worker call out today with Covid-19 symptoms, and plans to be tested today. Ms. Judy [name] has had close contact with extended time with this co-worker however she remained masked. Co-worker cannot return to work until tested. Judy [name] is asymptomatic at this time.  After consultation with  Infection prevention, Ms. Judy [name] has been instructed to not return to CR until her co-workers test is resulted. Ms. Judy [name] will call CR with this information and to receive further instructions for attendance.   Judy [name], BSN

## 2019-05-03 ENCOUNTER — Encounter (HOSPITAL_COMMUNITY): Payer: 59

## 2019-05-05 ENCOUNTER — Encounter (HOSPITAL_COMMUNITY): Payer: 59

## 2019-05-08 ENCOUNTER — Other Ambulatory Visit: Payer: Self-pay

## 2019-05-08 ENCOUNTER — Encounter (HOSPITAL_COMMUNITY)
Admission: RE | Admit: 2019-05-08 | Discharge: 2019-05-08 | Disposition: A | Discharge status: Home / Self Care | Payer: 59 | Source: Ambulatory Visit | Attending: Cardiology | Admitting: Cardiology

## 2019-05-08 DIAGNOSIS — Z9889 Other specified postprocedural states: Secondary | ICD-10-CM | POA: Diagnosis present

## 2019-05-10 ENCOUNTER — Other Ambulatory Visit: Payer: Self-pay

## 2019-05-10 ENCOUNTER — Encounter (HOSPITAL_COMMUNITY)
Admission: RE | Admit: 2019-05-10 | Discharge: 2019-05-10 | Disposition: A | Discharge status: Home / Self Care | Payer: 59 | Source: Ambulatory Visit | Attending: Cardiology | Admitting: Cardiology

## 2019-05-10 DIAGNOSIS — Z9889 Other specified postprocedural states: Secondary | ICD-10-CM | POA: Diagnosis not present

## 2019-05-10 NOTE — Progress Notes (Signed)
Judy [name] says that she has an appointment with her cardiologist on Friday. Patient given a copy of her exercise flow sheets from cardiac rehab to take to her appointment.Barnet Pall, RN,BSN 05/10/2019 4:14 PM

## 2019-05-12 ENCOUNTER — Encounter (HOSPITAL_COMMUNITY)
Admission: RE | Admit: 2019-05-12 | Discharge: 2019-05-12 | Disposition: A | Discharge status: Home / Self Care | Payer: 59 | Source: Ambulatory Visit | Attending: Cardiology | Admitting: Cardiology

## 2019-05-12 ENCOUNTER — Other Ambulatory Visit: Payer: Self-pay

## 2019-05-12 DIAGNOSIS — Z9889 Other specified postprocedural states: Secondary | ICD-10-CM

## 2019-05-15 ENCOUNTER — Other Ambulatory Visit: Payer: Self-pay

## 2019-05-15 ENCOUNTER — Encounter (HOSPITAL_COMMUNITY)
Admission: RE | Admit: 2019-05-15 | Discharge: 2019-05-15 | Disposition: A | Discharge status: Home / Self Care | Payer: 59 | Source: Ambulatory Visit | Attending: Cardiology | Admitting: Cardiology

## 2019-05-15 DIAGNOSIS — Z9889 Other specified postprocedural states: Secondary | ICD-10-CM

## 2019-05-17 ENCOUNTER — Encounter (HOSPITAL_COMMUNITY)
Admission: RE | Admit: 2019-05-17 | Discharge: 2019-05-17 | Disposition: A | Discharge status: Home / Self Care | Payer: 59 | Source: Ambulatory Visit | Attending: Cardiology | Admitting: Cardiology

## 2019-05-17 ENCOUNTER — Other Ambulatory Visit: Payer: Self-pay

## 2019-05-17 DIAGNOSIS — Z9889 Other specified postprocedural states: Secondary | ICD-10-CM

## 2019-05-18 NOTE — Progress Notes (Signed)
Cardiac Individual Treatment Plan  Patient Details  Name: Judy [name] MRN: [id number] Date of Birth: [birth year] Referring Provider:     [place] from 03/21/2019 in [place]  Referring Provider  Lalla Brothers, MD [facility]) Fransico Him, MD (coverage)]      Initial Encounter Date:    CARDIAC REHAB PHASE II ORIENTATION from 03/21/2019 in [place]  Date  03/21/19      Visit Diagnosis: 01/17/19 Mitral Valve Repair  Patient's Home Medications on Admission:  Current Outpatient Medications:  .  acetaminophen (TYLENOL) 325 MG tablet, Take 325 mg by mouth every 6 (six) hours as needed for mild pain or headache., Disp: , Rfl:  .  aspirin EC 81 MG tablet, Take 1 tablet (81 mg total) by mouth daily., Disp: 150 tablet, Rfl: 2 .  clopidogrel (PLAVIX) 75 MG tablet, Take 75 mg by mouth daily., Disp: , Rfl:  .  nadolol (CORGARD) 20 MG tablet, Take 20 mg by mouth daily., Disp: , Rfl:  .  norethindrone (MICRONOR,CAMILA,ERRIN) 0.35 MG tablet, Take 1 tablet by mouth daily., Disp: , Rfl:   Past Medical History: Past Medical History:  Diagnosis Date  . Anemia   . GERD (gastroesophageal reflux disease)   . Hypotension   . Migraines   . MVP (mitral valve prolapse)     Tobacco Use: Social History   Tobacco Use  Smoking Status Never Smoker  Smokeless Tobacco Never Used    Labs: Recent Review Scientist, physiological    Labs for ITP Cardiac and Pulmonary Rehab Latest Ref Rng & Units 06/16/2018   Cholestrol 0 - 200 mg/dL 167   LDLCALC 0 - 99 mg/dL 103(H)   HDL >40 mg/dL 44   Trlycerides <150 mg/dL 99      Capillary Blood Glucose: No results found for: GLUCAP   Exercise Target Goals: Exercise Program Goal: Individual exercise prescription set using results from initial 6 min walk test and THRR while considering  patient's activity barriers and safety.   Exercise Prescription Goal: Starting with  aerobic activity 30 plus minutes a day, 3 days per week for initial exercise prescription. Provide home exercise prescription and guidelines that participant acknowledges understanding prior to discharge.  Activity Barriers & Risk Stratification: Activity Barriers & Cardiac Risk Stratification - 03/21/19 0752      Activity Barriers & Cardiac Risk Stratification   Activity Barriers  None    Cardiac Risk Stratification  Moderate       6 Minute Walk: 6 Minute Walk    Row Name 03/21/19 0800 05/15/19 1515       6 Minute Walk   Phase  Initial  Discharge    Distance  1522 feet  1815 feet    Distance % Change  -  19.25 %    Walk Time  6 minutes  6 minutes    # of Rest Breaks  0  0    MPH  2.88  3.44    METS  5.21  6.01    RPE  8  8    Perceived Dyspnea   0  0    VO2 Peak  18.22  21.02    Symptoms  No  No    Resting HR  73 bpm  78 bpm    Resting BP  90/62  102/64    Resting Oxygen Saturation   100 %  -    Exercise Oxygen Saturation  during 6  min walk  100 %  -    Max Ex. HR  81 bpm  101 bpm    Max Ex. BP  100/72  112/62    2 Minute Post BP  90/62  88/50 Recheck BP 96/64       Oxygen Initial Assessment:   Oxygen Re-Evaluation:   Oxygen Discharge (Final Oxygen Re-Evaluation):   Initial Exercise Prescription: Initial Exercise Prescription - 03/21/19 0900      Date of Initial Exercise RX and Referring Provider   Date  03/21/19    Referring Provider  Aurea Graff, MD [facility])   Armanda Magic, MD (coverage)   Expected Discharge Date  05/19/19      Treadmill   MPH  2.8    Grade  1    Minutes  15    METs  3.53      NuStep   Level  4    SPM  85    Minutes  15    METs  3.3      Prescription Details   Frequency (times per week)  3    Duration  Progress to 30 minutes of continuous aerobic without signs/symptoms of physical distress      Intensity   THRR 40-80% of Max Heartrate  74-145    Ratings of Perceived Exertion  11-13    Perceived Dyspnea  0-4       Progression   Progression  Continue to progress workloads to maintain intensity without signs/symptoms of physical distress.      Resistance Training   Training Prescription  Yes    Weight  3lbs    Reps  10-15       Perform Capillary Blood Glucose checks as needed.  Exercise Prescription Changes: Exercise Prescription Changes    Row Name 03/27/19 1600 04/03/19 1516 04/17/19 1517 05/15/19 1516       Response to Exercise   Blood Pressure (Admit)  118/64  108/66  94/64  102/64    Blood Pressure (Exercise)  120/62  118/74  90/50  112/62    Blood Pressure (Exit)  102/60  96/68  90/60  88/50 96/64 recheck    Heart Rate (Admit)  77 bpm  66 bpm  78 bpm  78 bpm    Heart Rate (Exercise)  102 bpm  110 bpm  113 bpm  130 bpm    Heart Rate (Exit)  84 bpm  77 bpm  88 bpm  87 bpm    Rating of Perceived Exertion (Exercise)  Symptoms  none  none  none  none    Comments  Patient tolerated 1st exercise session well without symptoms.  -  -  -    Duration  Progress to 30 minutes of  aerobic without signs/symptoms of physical distress  Progress to 30 minutes of  aerobic without signs/symptoms of physical distress  Progress to 30 minutes of  aerobic without signs/symptoms of physical distress  Progress to 30 minutes of  aerobic without signs/symptoms of physical distress    Intensity  THRR unchanged  THRR unchanged  THRR unchanged  THRR unchanged      Progression   Progression  Continue to progress workloads to maintain intensity without signs/symptoms of physical distress.  Continue to progress workloads to maintain intensity without signs/symptoms of physical distress.  Continue to progress workloads to maintain intensity without signs/symptoms of physical distress.  Continue to progress workloads to maintain intensity without signs/symptoms  of physical distress.    Average METs  2.9  3.2  4.1  4.5      Resistance Training   Training Prescription  Yes  Yes  Yes  Yes    Weight  3lbs   3lbs  3lbs  4lbs    Reps  10-15  10-15  10-15  10-15    Time  10 Minutes  10 Minutes  10 Minutes  10 Minutes      Interval Training   Interval Training  No  No  No  No      Treadmill   MPH  2.8  2.8  3  3.4    Grade  1  2  2  2     Minutes  15  15  15  15     METs  3.53  3.92  4.12  4.54      NuStep   Level  3  3  4  4     SPM  85  85  85  85    Minutes  15  15  15  15     METs  2.3  2.4  4  4.4      Home Exercise Plan   Plans to continue exercise at  -  Home (comment) Walking, aerobics videos  Home (comment) Walking, aerobics videos  Home (comment) Walking, aerobics videos    Frequency  -  Add 2 additional days to program exercise sessions.  Add 2 additional days to program exercise sessions.  Add 2 additional days to program exercise sessions.    Initial Home Exercises Provided  -  04/03/19  04/03/19  04/03/19       Exercise Comments: Exercise Comments    Row Name 03/27/19 1605 04/03/19 1544 04/05/19 1555 04/17/19 1530 05/15/19 1613   Exercise Comments  Patient tolerated 1st session of exercise well without symptoms.  Reviewed home exercise guidelines and goals with patient.  Reviewed METs and goals with patient. Increased workload on treadmill to help build cardiorespiratory fitness with a goal to return to jogging.  Reviewed METs and goals with patient.  Reviewed goals with patient.      Exercise Goals and Review: Exercise Goals    Row Name 03/21/19 0745             Exercise Goals   Increase Physical Activity  Yes       Intervention  Provide advice, education, support and counseling about physical activity/exercise needs.;Develop an individualized exercise prescription for aerobic and resistive training based on initial evaluation findings, risk stratification, comorbidities and participant's personal goals.       Expected Outcomes  Short Term: Attend rehab on a regular basis to increase amount of physical activity.;Long Term: Exercising regularly at least 3-5 days a  week.;Long Term: Add in home exercise to make exercise part of routine and to increase amount of physical activity.       Increase Strength and Stamina  Yes       Intervention  Provide advice, education, support and counseling about physical activity/exercise needs.;Develop an individualized exercise prescription for aerobic and resistive training based on initial evaluation findings, risk stratification, comorbidities and participant's personal goals.       Expected Outcomes  Short Term: Increase workloads from initial exercise prescription for resistance, speed, and METs.;Short Term: Perform resistance training exercises routinely during rehab and add in resistance training at home;Long Term: Improve cardiorespiratory fitness, muscular endurance and strength as measured by increased METs and functional capacity (  )       Able to understand and use rate of perceived exertion (RPE) scale  Yes       Intervention  Provide education and explanation on how to use RPE scale       Expected Outcomes  Short Term: Able to use RPE daily in rehab to express subjective intensity level;Long Term:  Able to use RPE to guide intensity level when exercising independently       Knowledge and understanding of Target Heart Rate Range (THRR)  Yes       Intervention  Provide education and explanation of THRR including how the numbers were predicted and where they are located for reference       Expected Outcomes  Short Term: Able to state/look up THRR;Long Term: Able to use THRR to govern intensity when exercising independently;Short Term: Able to use daily as guideline for intensity in rehab       Able to check pulse independently  Yes       Intervention  Provide education and demonstration on how to check pulse in carotid and radial arteries.;Review the importance of being able to check your own pulse for safety during independent exercise       Expected Outcomes  Short Term: Able to explain why pulse checking is  important during independent exercise;Long Term: Able to check pulse independently and accurately       Understanding of Exercise Prescription  Yes       Intervention  Provide education, explanation, and written materials on patient's individual exercise prescription       Expected Outcomes  Short Term: Able to explain program exercise prescription;Long Term: Able to explain home exercise prescription to exercise independently          Exercise Goals Re-Evaluation : Exercise Goals Re-Evaluation    Row Name 03/27/19 1605 04/03/19 1544 04/17/19 1530 05/15/19 1613       Exercise Goal Re-Evaluation   Exercise Goals Review  Able to understand and use rate of perceived exertion (RPE) scale;Knowledge and understanding of Target Heart Rate Range (THRR);Increase Physical Activity  Able to understand and use rate of perceived exertion (RPE) scale;Knowledge and understanding of Target Heart Rate Range (THRR);Increase Physical Activity;Able to check pulse independently;Understanding of Exercise Prescription  Able to understand and use rate of perceived exertion (RPE) scale;Knowledge and understanding of Target Heart Rate Range (THRR);Increase Physical Activity;Able to check pulse independently;Understanding of Exercise Prescription;Increase Strength and Stamina  Able to understand and use rate of perceived exertion (RPE) scale;Knowledge and understanding of Target Heart Rate Range (THRR);Increase Physical Activity;Able to check pulse independently;Understanding of Exercise Prescription;Increase Strength and Stamina    Comments  Patient able to understand and use RPE scale appropriately. Reviewed THRR for exercise.  Reviewed home exericse guidelines and patient is walking 20-30 minutes and/or working out to aerobics video 2 days/week. Pt has a heart rate watch to check her pulse. Increased incline on treadmill to help build stamina.  Patient is exercising to aerobics videos at mild-moderate intensity. Pt feels like  her stamina is better.  Patient will be completing the cardiac rehab program this week and has progressed well achieving 4.5 METs with exercise. Patient's functional capacity increased 19% as measured by , strength remained the same, and flexibilty increased 7% as measured by sit-and-reach test. Pt states that she has more energy, and she is confident she will be able to continue her exercise routine at home. Pt is walking, working out to [employer], and she  has 3lb weights at home that she's using.    Expected Outcomes  Increase workloads as tolerated to help improve cardiorespiratory fitness.  Patient will exercise 30 minutes, at least 2 days/week in addition to exercise at cardiac rehab to help build stamina. Increase workloads at cardiac rehab as tolerated.  Continue current exercise routine to help achieve personal health and fitness goals.  Patient will continue home exercise routine to help maintain health and fitness gains.        Discharge Exercise Prescription (Final Exercise Prescription Changes): Exercise Prescription Changes - 05/15/19 1516      Response to Exercise   Blood Pressure (Admit)  102/64    Blood Pressure (Exercise)  112/62    Blood Pressure (Exit)  88/50   96/64 recheck   Heart Rate (Admit)  78 bpm    Heart Rate (Exercise)  130 bpm    Heart Rate (Exit)  87 bpm    Rating of Perceived Exertion (Exercise)  11    Symptoms  none    Duration  Progress to 30 minutes of  aerobic without signs/symptoms of physical distress    Intensity  THRR unchanged      Progression   Progression  Continue to progress workloads to maintain intensity without signs/symptoms of physical distress.    Average METs  4.5      Resistance Training   Training Prescription  Yes    Weight  4lbs    Reps  10-15    Time  10 Minutes      Interval Training   Interval Training  No      Treadmill   MPH  3.4    Grade  2    Minutes  15    METs  4.54      NuStep   Level  4    SPM  85     Minutes  15    METs  4.4      Home Exercise Plan   Plans to continue exercise at  Home (comment)   Walking, aerobics videos   Frequency  Add 2 additional days to program exercise sessions.    Initial Home Exercises Provided  04/03/19       Nutrition:  Target Goals: Understanding of nutrition guidelines, daily intake of sodium 1500mg , cholesterol 200mg , calories 30% from fat and 7% or less from saturated fats, daily to have 5 or more servings of fruits and vegetables.  Biometrics: Pre Biometrics - 03/21/19 0745      Pre Biometrics   Height  5\' 5"  (1.651 m)    Weight  56.8 kg    Waist Circumference  26 inches    Hip Circumference  38.5 inches    Waist to Hip Ratio  0.68 %    BMI (Calculated)  20.84    Triceps Skinfold  24 mm    % Body Fat  28.8 %    Grip Strength  32 kg    Flexibility  18.25 in    Single Leg Stand  30 seconds      Post Biometrics - 05/15/19 1600       Post  Biometrics   Waist Circumference  26 inches    Hip Circumference  36.25 inches    Waist to Hip Ratio  0.72 %    Triceps Skinfold  23 mm    Grip Strength  32.25 kg    Flexibility  19.5 in    Single Leg Stand  30 seconds  Nutrition Therapy Plan and Nutrition Goals:   Nutrition Assessments:   Nutrition Goals Re-Evaluation:   Nutrition Goals Discharge (Final Nutrition Goals Re-Evaluation):   Psychosocial: Target Goals: Acknowledge presence or absence of significant depression and/or stress, maximize coping skills, provide positive support system. Participant is able to verbalize types and ability to use techniques and skills needed for reducing stress and depression.  Initial Review & Psychosocial Screening: Initial Psych Review & Screening - 03/21/19 0820      Initial Review   Current issues with  None Identified      Family Dynamics   Good Support System?  Yes    Comments  Patient states she has a very strong support system and denies psychosocial barriers to health  management and participation in CR at this time. She will continue to maintain a positive attitude and participate in healthy stress management      Barriers   Psychosocial barriers to participate in program  There are no identifiable barriers or psychosocial needs.      Screening Interventions   Interventions  Encouraged to exercise       Quality of Life Scores: Quality of Life - 03/21/19 0925      Quality of Life   Select  Quality of Life      Quality of Life Scores   Health/Function Pre  27.27 %    Socioeconomic Pre  28 %    Psych/Spiritual Pre  27.17 %    Family Pre  28.8 %    GLOBAL Pre  27.64 %      Scores of 19 and below usually indicate a poorer quality of life in these areas.  A difference of  2-3 points is a clinically meaningful difference.  A difference of 2-3 points in the total score of the Quality of Life Index has been associated with significant improvement in overall quality of life, self-image, physical symptoms, and general health in studies assessing change in quality of life.  PHQ-9: Recent Review Flowsheet Data    Depression screen [facility] 2/9 03/27/2019 03/21/2019 09/19/2016 09/08/2016 06/16/2015   Decreased Interest 0 0 0 0 0   Down, Depressed, Hopeless 0 0 0 0 0   PHQ - 2 Score 0 0 0 0 0     Interpretation of Total Score  Total Score Depression Severity:  1-4 = Minimal depression, 5-9 = Mild depression, 10-14 = Moderate depression, 15-19 = Moderately severe depression, 20-27 = Severe depression   Psychosocial Evaluation and Intervention: Psychosocial Evaluation - 03/27/19 1630      Psychosocial Evaluation & Interventions   Continue Psychosocial Services   No Follow up required       Psychosocial Re-Evaluation: Psychosocial Re-Evaluation    Row Name 04/14/19 1206 05/16/19 1229           Psychosocial Re-Evaluation   Current issues with  None Identified  -      Comments  Judy [name] denies psychosocial barriers to participation in CR and self health  management. She continues to utilize her strong support system which includes family, friends, and healthcare team.  Judy [name] denies psychosocial barriers to participation in CR and self health management. She continues to utilize her strong support system which includes family, friends, and healthcare team.      Expected Outcomes  Judy [name] will continue to have a positive outlook and healthy stress management. She will utilize support system if needs arise.  Judy [name] will continue to have a positive outlook and healthy stress management. She will  utilize support system if needs arise.      Interventions  Encouraged to attend Cardiac Rehabilitation for the exercise  Encouraged to attend Cardiac Rehabilitation for the exercise      Continue Psychosocial Services   No Follow up required  No Follow up required         Psychosocial Discharge (Final Psychosocial Re-Evaluation): Psychosocial Re-Evaluation - 05/16/19 1229      Psychosocial Re-Evaluation   Comments  Judy [name] denies psychosocial barriers to participation in CR and self health management. She continues to utilize her strong support system which includes family, friends, and healthcare team.    Expected Outcomes  Judy [name] will continue to have a positive outlook and healthy stress management. She will utilize support system if needs arise.    Interventions  Encouraged to attend Cardiac Rehabilitation for the exercise    Continue Psychosocial Services   No Follow up required       Vocational Rehabilitation: Provide vocational rehab assistance to qualifying candidates.   Vocational Rehab Evaluation & Intervention: Vocational Rehab - 03/21/19 0819      Initial Vocational Rehab Evaluation & Intervention   Assessment shows need for Vocational Rehabilitation  No       Education: Education Goals: Education classes will be provided on a weekly basis, covering required topics. Participant will state understanding/return demonstration of topics  presented.  Learning Barriers/Preferences: Learning Barriers/Preferences - 03/21/19 0752      Learning Barriers/Preferences   Learning Barriers  None    Learning Preferences  None       Education Topics: Hypertension, Hypertension Reduction -Define heart disease and high blood pressure. Discus how high blood pressure affects the body and ways to reduce high blood pressure.   Exercise and Your Heart -Discuss why it is important to exercise, the FITT principles of exercise, normal and abnormal responses to exercise, and how to exercise safely.   Angina -Discuss definition of angina, causes of angina, treatment of angina, and how to decrease risk of having angina.   Cardiac Medications -Review what the following cardiac medications are used for, how they affect the body, and side effects that may occur when taking the medications.  Medications include Aspirin, Beta blockers, calcium channel blockers, ACE Inhibitors, angiotensin receptor blockers, diuretics, digoxin, and antihyperlipidemics.   Congestive Heart Failure -Discuss the definition of CHF, how to live with CHF, the signs and symptoms of CHF, and how keep track of weight and sodium intake.   Heart Disease and Intimacy -Discus the effect sexual activity has on the heart, how changes occur during intimacy as we age, and safety during sexual activity.   Smoking Cessation / COPD -Discuss different methods to quit smoking, the health benefits of quitting smoking, and the definition of COPD.   Nutrition I: Fats -Discuss the types of cholesterol, what cholesterol does to the heart, and how cholesterol levels can be controlled.   Nutrition II: Labels -Discuss the different components of food labels and how to read food label   Heart Parts/Heart Disease and PAD -Discuss the anatomy of the heart, the pathway of blood circulation through the heart, and these are affected by heart disease.   Stress I: Signs and  Symptoms -Discuss the causes of stress, how stress may lead to anxiety and depression, and ways to limit stress.   Stress II: Relaxation -Discuss different types of relaxation techniques to limit stress.   Warning Signs of Stroke / TIA -Discuss definition of a stroke, what the signs and symptoms are  of a stroke, and how to identify when someone is having stroke.   Knowledge Questionnaire Score: Knowledge Questionnaire Score - 03/21/19 0926      Knowledge Questionnaire Score   Pre Score  21/24       Core Components/Risk Factors/Patient Goals at Admission: Personal Goals and Risk Factors at Admission - 03/21/19 0745      Core Components/Risk Factors/Patient Goals on Admission   Personal Goal Other  Yes    Personal Goal  Be able to run.    Intervention  Provide individualized exercise routine including aerobic training, stretching, and resistance training to help increase stamina, so that patient can return to running.    Expected Outcomes  Patient will achieve increased cardiorespiratory fitness. Patient will be able to add running to her exercise routine.       Core Components/Risk Factors/Patient Goals Review:  Goals and Risk Factor Review    Row Name 03/27/19 1631 04/14/19 1208 05/16/19 1229         Core Components/Risk Factors/Patient Goals Review   Review  Patient has no CAD risk factors. She is very eager to participate in CR  Patient has no CAD risk factors. She continues to be very eager to participate in CR.  Patient has no CAD risk factors. She continues to be very eager to participate in CR and exercise at home to return her stamina and strength back to baseline post MV repair.     Expected Outcomes  Patient will continue to participate in CR to increase her stamina and strength and meet her personal goal of being able to run for exercise again.  Patient will continue to participate in CR to increase her stamina and strength and meet her personal goal of being able to  run for exercise again.  Patient will continue to participate in CR to increase her stamina and strength and meet her personal goal of being able to run for exercise again.        Core Components/Risk Factors/Patient Goals at Discharge (Final Review):  Goals and Risk Factor Review - 05/16/19 1229      Core Components/Risk Factors/Patient Goals Review   Review  Patient has no CAD risk factors. She continues to be very eager to participate in CR and exercise at home to return her stamina and strength back to baseline post MV repair.    Expected Outcomes  Patient will continue to participate in CR to increase her stamina and strength and meet her personal goal of being able to run for exercise again.       ITP Comments: ITP Comments    Row Name 03/21/19 0731 03/27/19 1627 04/14/19 1159 05/16/19 1159 05/16/19 1227   ITP Comments  Medical Director- Dr. Armanda Magicraci Turner, MD  Ms. Larsson started exercise today in CR and tolerated well  30 day ITP review: Judy [name] continues to do well in CR. She is tolerating work load increases and her attendance is perfect. She is also exercising at home. She is eager to learn risk factor modification. She has a strong support system composed of family, friends, and health care providers.  30 day ITP review: Judy [name] continues to do well in CR. She has tolerated work load increases and her attendance has been outstanding. She continues to request appropriate workload increases and is exercising at home. She is eager to continue risk factor modification. She has a strong support system composed of family, friends, and health care providers.  (Pended)   30 day ITP review:  Judy [name] continues to do well in CR. She is tolerating work load increases and her attendance is excellent. She has established a home exercise routein. She is eager to continue factor modification. She has a strong support system composed of family, friends, and health care providers. She will complete her last  session of cardiac rehab on 05/19/2019.      Comments: see ITP comments

## 2019-05-19 ENCOUNTER — Other Ambulatory Visit: Payer: Self-pay

## 2019-05-19 ENCOUNTER — Encounter (HOSPITAL_COMMUNITY)
Admission: RE | Admit: 2019-05-19 | Discharge: 2019-05-19 | Disposition: A | Discharge status: Home / Self Care | Payer: 59 | Source: Ambulatory Visit | Attending: Cardiology | Admitting: Cardiology

## 2019-05-19 VITALS — BP 102/60 | HR 78 | Temp 98.1°F | Ht 65.0 in | Wt 119.9 lb

## 2019-05-19 DIAGNOSIS — Z9889 Other specified postprocedural states: Secondary | ICD-10-CM | POA: Diagnosis not present

## 2019-05-19 NOTE — Progress Notes (Signed)
Discharge Progress Report  Patient Details  Name: Judy [name] MRN: [id number] Date of Birth: [birth year] Referring Provider:     [place] from 03/21/2019 in [place]  Referring Provider  Lalla Brothers, MD [facility]) Fransico Him, MD (coverage)]       Number of Visits: 18  Reason for Discharge:  Patient reached a stable level of exercise. Patient independent in their exercise. Patient has met program and personal goals.  Smoking History:  Social History   Tobacco Use  Smoking Status Never Smoker  Smokeless Tobacco Never Used    Diagnosis:  01/17/19 Mitral Valve Repair  ADL [facility]SD:   Initial Exercise Prescription: Initial Exercise Prescription - 03/21/19 0900      Date of Initial Exercise RX and Referring Provider   Date  03/21/19    Referring Provider  Lalla Brothers, MD [facility])   Fransico Him, MD (coverage)   Expected Discharge Date  05/19/19      Treadmill   MPH  2.8    Grade  1    Minutes  15    METs  3.53      NuStep   Level  4    SPM  85    Minutes  15    METs  3.3      Prescription Details   Frequency (times per week)  3    Duration  Progress to 30 minutes of continuous aerobic without signs/symptoms of physical distress      Intensity   THRR 40-80% of Max Heartrate  74-145    Ratings of Perceived Exertion  11-13    Perceived Dyspnea  0-4      Progression   Progression  Continue to progress workloads to maintain intensity without signs/symptoms of physical distress.      Resistance Training   Training Prescription  Yes    Weight  3lbs    Reps  10-15       Discharge Exercise Prescription (Final Exercise Prescription Changes): Exercise Prescription Changes - 05/19/19 1511      Response to Exercise   Blood Pressure (Admit)  102/60    Blood Pressure (Exercise)  94/50    Blood Pressure (Exit)  90/52    Heart Rate (Admit)  78 bpm    Heart Rate (Exercise)  126 bpm    Heart  Rate (Exit)  78 bpm    Rating of Perceived Exertion (Exercise)  11    Symptoms  none    Duration  Progress to 30 minutes of  aerobic without signs/symptoms of physical distress    Intensity  THRR unchanged      Progression   Progression  Continue to progress workloads to maintain intensity without signs/symptoms of physical distress.    Average METs  4.6      Resistance Training   Training Prescription  Yes    Weight  4lbs    Reps  10-15    Time  10 Minutes      Interval Training   Interval Training  No      Treadmill   MPH  3.4    Grade  2    Minutes  15    METs  4.54      NuStep   Level  4    SPM  85    Minutes  15    METs  4.7      Home Exercise Plan   Plans to continue exercise at  Home (comment)   Walking, aerobics videos   Frequency  Add 2 additional days to program exercise sessions.    Initial Home Exercises Provided  04/03/19       Functional Capacity: 6 Minute Walk    Row Name 03/21/19 0800 05/15/19 1515       6 Minute Walk   Phase  Initial  Discharge    Distance  1522 feet  1815 feet    Distance % Change  -  19.25 %    Walk Time  6 minutes  6 minutes    # of Rest Breaks  0  0    MPH  2.88  3.44    METS  5.21  6.01    RPE  8  8    Perceived Dyspnea   0  0    VO2 Peak  18.22  21.02    Symptoms  No  No    Resting HR  73 bpm  78 bpm    Resting BP  90/62  102/64    Resting Oxygen Saturation   100 %  -    Exercise Oxygen Saturation  during 6 min walk  100 %  -    Max Ex. HR  81 bpm  101 bpm    Max Ex. BP  100/72  112/62    2 Minute Post BP  90/62  88/50 Recheck BP 96/64       Psychological, QOL, Others - Outcomes: PHQ 2/9: Depression screen [facility] 2/9 03/27/2019 03/21/2019 09/19/2016 09/08/2016 06/16/2015  Decreased Interest 0 0 0 0 0  Down, Depressed, Hopeless 0 0 0 0 0  PHQ - 2 Score 0 0 0 0 0    Quality of Life: Quality of Life - 05/19/19 1030      Quality of Life   Select  Quality of Life      Quality of Life Scores   Health/Function  Pre  27.27 %    Health/Function Post  26.33 %    Health/Function % Change  -3.45 %    Socioeconomic Pre  28 %    Socioeconomic Post  26 %    Socioeconomic % Change   -7.14 %    Psych/Spiritual Pre  27.17 %    Psych/Spiritual Post  28.21 %    Psych/Spiritual % Change  3.83 %    Family Pre  28.8 %    Family Post  29.5 %    Family % Change  2.43 %    GLOBAL Pre  27.64 %    GLOBAL Post  27.12 %    GLOBAL % Change  -1.88 %       Personal Goals: Goals established at orientation with interventions provided to work toward goal. Personal Goals and Risk Factors at Admission - 03/21/19 0745      Core Components/Risk Factors/Patient Goals on Admission   Personal Goal Other  Yes    Personal Goal  Be able to run.    Intervention  Provide individualized exercise routine including aerobic training, stretching, and resistance training to help increase stamina, so that patient can return to running.    Expected Outcomes  Patient will achieve increased cardiorespiratory fitness. Patient will be able to add running to her exercise routine.        Personal Goals Discharge: Goals and Risk Factor Review    Row Name 03/27/19 1631 04/14/19 1208 05/16/19 1229 05/22/19 0646       Core Components/Risk Factors/Patient Goals Review  Review  Patient has no CAD risk factors. She is very eager to participate in CR  Patient has no CAD risk factors. She continues to be very eager to participate in CR.  Patient has no CAD risk factors. She continues to be very eager to participate in CR and exercise at home to return her stamina and strength back to baseline post MV repair.  Patient has no CAD risk factors. She feels she is managing her health very well post MV repair.    Expected Outcomes  Patient will continue to participate in CR to increase her stamina and strength and meet her personal goal of being able to run for exercise again.  Patient will continue to participate in CR to increase her stamina and  strength and meet her personal goal of being able to run for exercise again.  Patient will continue to participate in CR to increase her stamina and strength and meet her personal goal of being able to run for exercise again.  Patient will continue to life a healthy lifestyle including daily exercise, healthy nutrition, and healthy stress management.       Exercise Goals and Review: Exercise Goals    Row Name 03/21/19 0745             Exercise Goals   Increase Physical Activity  Yes       Intervention  Provide advice, education, support and counseling about physical activity/exercise needs.;Develop an individualized exercise prescription for aerobic and resistive training based on initial evaluation findings, risk stratification, comorbidities and participant's personal goals.       Expected Outcomes  Short Term: Attend rehab on a regular basis to increase amount of physical activity.;Long Term: Exercising regularly at least 3-5 days a week.;Long Term: Add in home exercise to make exercise part of routine and to increase amount of physical activity.       Increase Strength and Stamina  Yes       Intervention  Provide advice, education, support and counseling about physical activity/exercise needs.;Develop an individualized exercise prescription for aerobic and resistive training based on initial evaluation findings, risk stratification, comorbidities and participant's personal goals.       Expected Outcomes  Short Term: Increase workloads from initial exercise prescription for resistance, speed, and METs.;Short Term: Perform resistance training exercises routinely during rehab and add in resistance training at home;Long Term: Improve cardiorespiratory fitness, muscular endurance and strength as measured by increased METs and functional capacity (6MWT)       Able to understand and use rate of perceived exertion (RPE) scale  Yes       Intervention  Provide education and explanation on how to use  RPE scale       Expected Outcomes  Short Term: Able to use RPE daily in rehab to express subjective intensity level;Long Term:  Able to use RPE to guide intensity level when exercising independently       Knowledge and understanding of Target Heart Rate Range (THRR)  Yes       Intervention  Provide education and explanation of THRR including how the numbers were predicted and where they are located for reference       Expected Outcomes  Short Term: Able to state/look up THRR;Long Term: Able to use THRR to govern intensity when exercising independently;Short Term: Able to use daily as guideline for intensity in rehab       Able to check pulse independently  Yes       Intervention  Provide education and demonstration on how to check pulse in carotid and radial arteries.;Review the importance of being able to check your own pulse for safety during independent exercise       Expected Outcomes  Short Term: Able to explain why pulse checking is important during independent exercise;Long Term: Able to check pulse independently and accurately       Understanding of Exercise Prescription  Yes       Intervention  Provide education, explanation, and written materials on patient's individual exercise prescription       Expected Outcomes  Short Term: Able to explain program exercise prescription;Long Term: Able to explain home exercise prescription to exercise independently          Exercise Goals Re-Evaluation: Exercise Goals Re-Evaluation    Row Name 03/27/19 1605 04/03/19 1544 04/17/19 1530 05/15/19 1613 05/19/19 1624     Exercise Goal Re-Evaluation   Exercise Goals Review  Able to understand and use rate of perceived exertion (RPE) scale;Knowledge and understanding of Target Heart Rate Range (THRR);Increase Physical Activity  Able to understand and use rate of perceived exertion (RPE) scale;Knowledge and understanding of Target Heart Rate Range (THRR);Increase Physical Activity;Able to check pulse  independently;Understanding of Exercise Prescription  Able to understand and use rate of perceived exertion (RPE) scale;Knowledge and understanding of Target Heart Rate Range (THRR);Increase Physical Activity;Able to check pulse independently;Understanding of Exercise Prescription;Increase Strength and Stamina  Able to understand and use rate of perceived exertion (RPE) scale;Knowledge and understanding of Target Heart Rate Range (THRR);Increase Physical Activity;Able to check pulse independently;Understanding of Exercise Prescription;Increase Strength and Stamina  Able to understand and use rate of perceived exertion (RPE) scale;Knowledge and understanding of Target Heart Rate Range (THRR);Increase Physical Activity;Able to check pulse independently;Understanding of Exercise Prescription;Increase Strength and Stamina   Comments  Patient able to understand and use RPE scale appropriately. Reviewed THRR for exercise.  Reviewed home exericse guidelines and patient is walking 20-30 minutes and/or working out to aerobics video 2 days/week. Pt has a heart rate watch to check her pulse. Increased incline on treadmill to help build stamina.  Patient is exercising to aerobics videos at mild-moderate intensity. Pt feels like her stamina is better.  Patient will be completing the cardiac rehab program this week and has progressed well achieving 4.5 METs with exercise. Patient's functional capacity increased 19% as measured by 6MWT, strength remained the same, and flexibilty increased 7% as measured by sit-and-reach test. Pt states that she has more energy, and she is confident she will be able to continue her exercise routine at home. Pt is walking, working out to [employer], and she has 3lb weights at home that she's using.  Patient completed the phase 2 cardiac rehab program and will continue walking and doing aerobics at home as her mode of exercise.   Expected Outcomes  Increase workloads as tolerated to help  improve cardiorespiratory fitness.  Patient will exercise 30 minutes, at least 2 days/week in addition to exercise at cardiac rehab to help build stamina. Increase workloads at cardiac rehab as tolerated.  Continue current exercise routine to help achieve personal health and fitness goals.  Patient will continue home exercise routine to help maintain health and fitness gains.  Patient will walk and/or aerobics 30-45 minutes, 5-7 days/week to maintain fitness and health improvements.      Nutrition & Weight - Outcomes: Pre Biometrics - 03/21/19 0745      Pre Biometrics   Height  '5\' 5"'$  (1.651 m)  Weight  56.8 kg    Waist Circumference  26 inches    Hip Circumference  38.5 inches    Waist to Hip Ratio  0.68 %    BMI (Calculated)  20.84    Triceps Skinfold  24 mm    % Body Fat  28.8 %    Grip Strength  32 kg    Flexibility  18.25 in    Single Leg Stand  30 seconds      Post Biometrics - 05/19/19 1511       Post  Biometrics   Height  '5\' 5"'$  (1.651 m)    Weight  54.4 kg    Waist Circumference  26 inches    Hip Circumference  36.25 inches    Waist to Hip Ratio  0.72 %    BMI (Calculated)  19.96    Triceps Skinfold  23 mm    % Body Fat  28.2 %    Grip Strength  32.25 kg    Flexibility  19.5 in    Single Leg Stand  30 seconds       Nutrition:   Nutrition Discharge:   Education Questionnaire Score: Knowledge Questionnaire Score - 05/19/19 1027      Knowledge Questionnaire Score   Pre Score  21/24    Post Score  21/24       Pt graduated from cardiac rehab program today with completion of 18 exercise sessions in Phase II. Pt maintained good attendance and progressed nicely during his participation in rehab as evidenced by increased MET level. Medication list reconciled. Repeat  PHQ score-0.  Pt has made significant lifestyle changes and should be commended for his success. Pt feels he has achieved his goals during cardiac rehab.  Goals reviewed with patient; copy given to  patient.

## 2019-12-18 IMAGING — CR DG CHEST 2V
1 series · 2 of 2 positions shown · non-contrast
Comparison: None.

CLINICAL DATA: 32-year-old female with double vision and dizziness.

EXAM:
CHEST - 2 VIEW

[Series 1: w chest pa · 0.14mm/px · 2 of 2 slices shown]
[im 1/2]
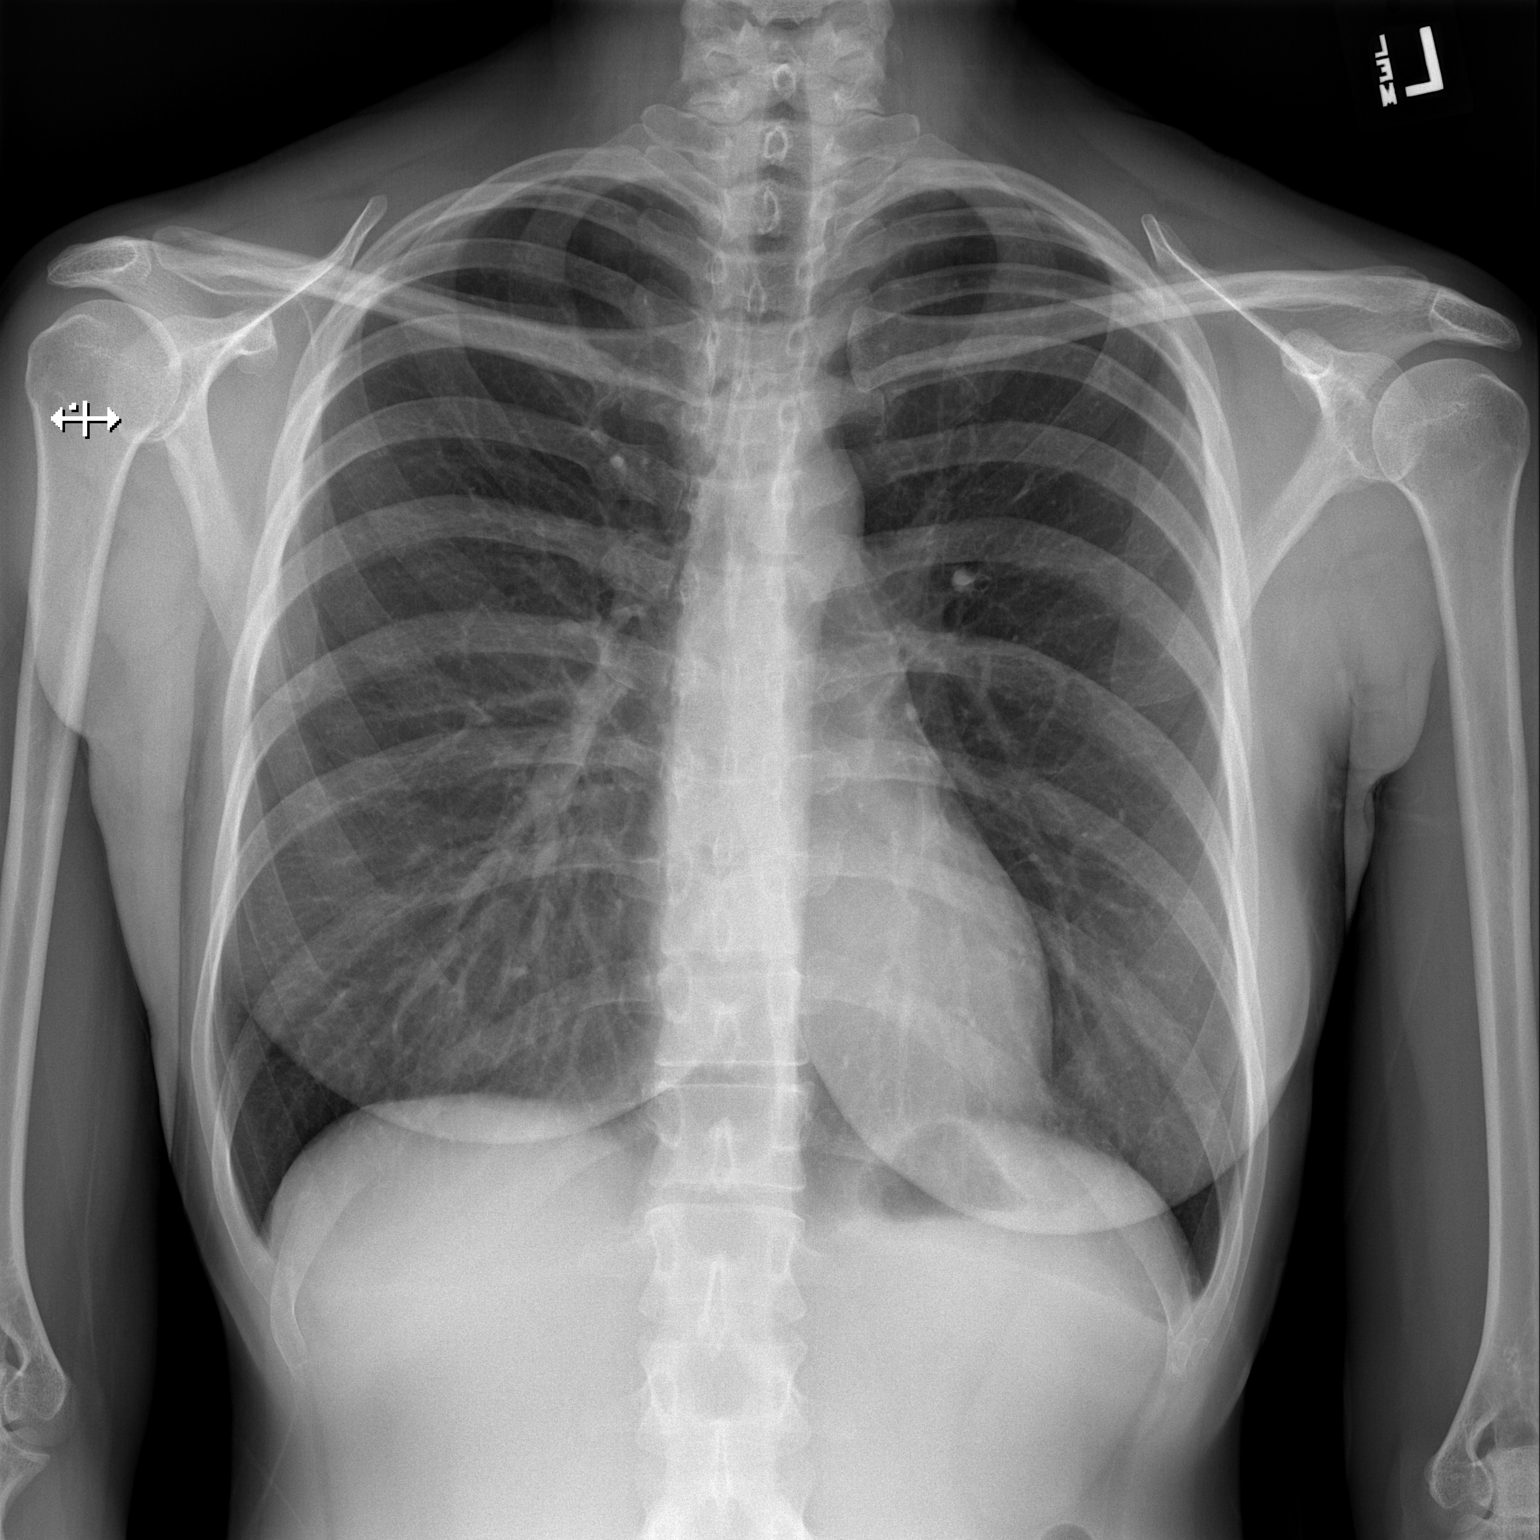
[im 2/2]
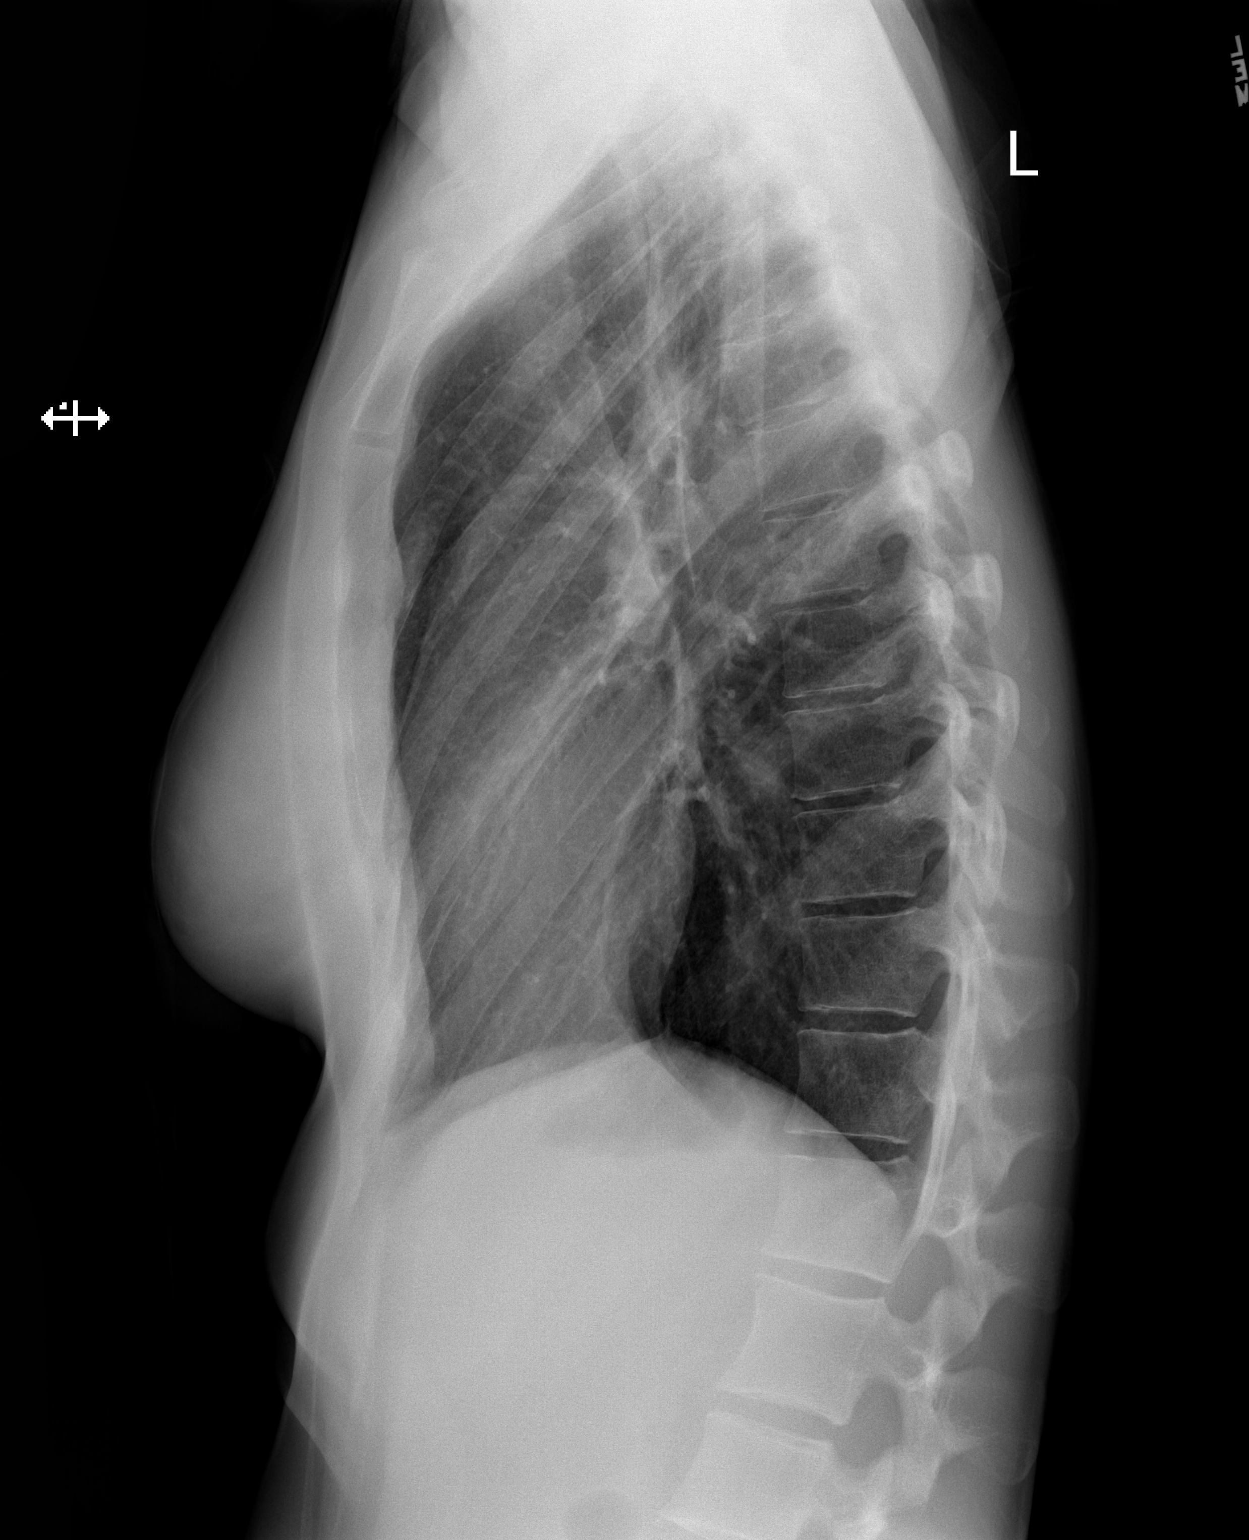

[2 of 2 positions shown; findings below may reference images not displayed]

FINDINGS: Normal lung volumes and mediastinal contours. Visualized tracheal
air column is within normal limits. Both lungs are clear. No
pneumothorax or pleural effusion. No osseous abnormality identified.
Negative visible upper abdomen.
IMPRESSION: Negative.

## 2019-12-18 IMAGING — CT CT HEAD W/O CM
3 series · 16 of 47 positions shown, 19 images · non-contrast
Comparison: None.

CLINICAL DATA: Dizziness, double vision.

EXAM:
CT HEAD WITHOUT CONTRAST
TECHNIQUE: Contiguous axial images were obtained from the base of the skull
through the vertex without intravenous contrast.

[Series 2: head wo · axial · 0.44mm/px · z∈[-131,-6]mm · 10 of 30 slices shown, 13 images]
[im 3/30  brain]
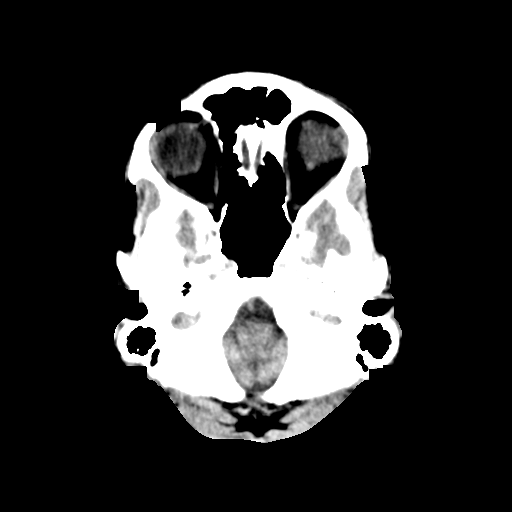
[im 3/30  bone]
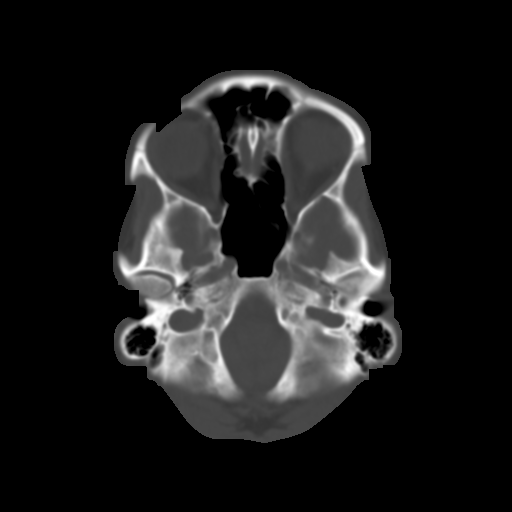
[im 6/30  brain]
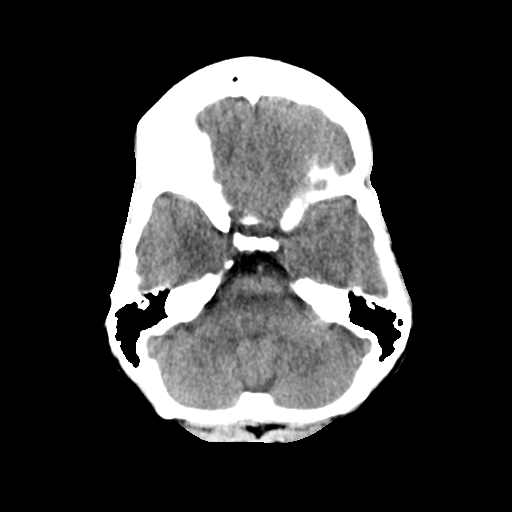
[im 9/30  brain]
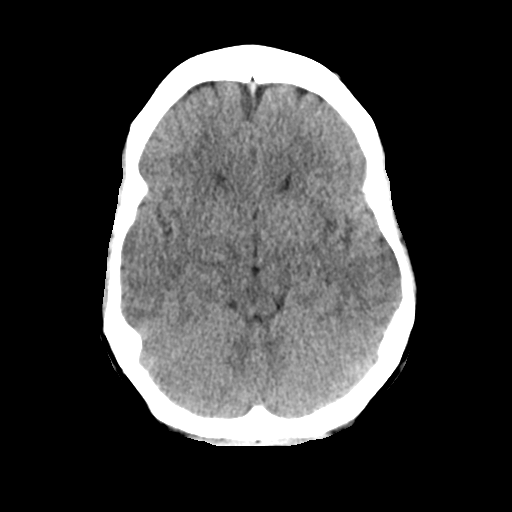
[im 11/30  brain]
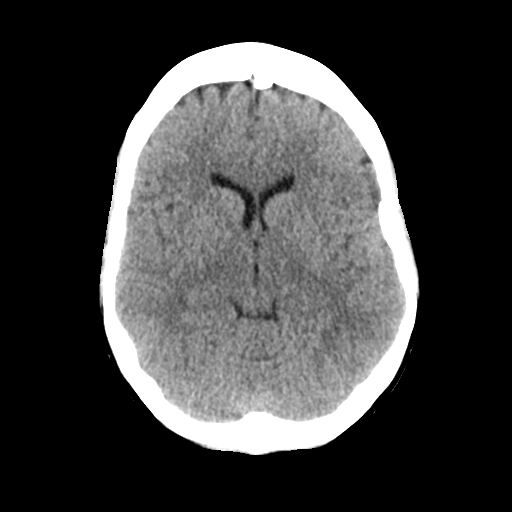
[im 14/30  brain]
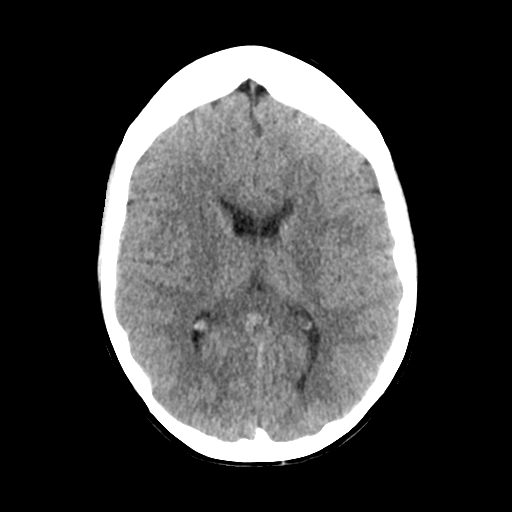
[im 14/30  bone]
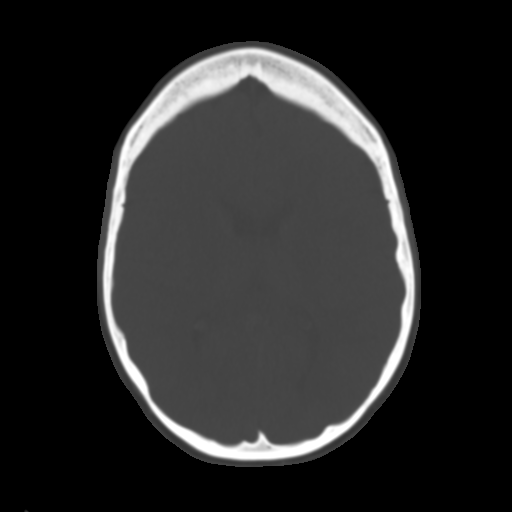
[im 17/30  brain]
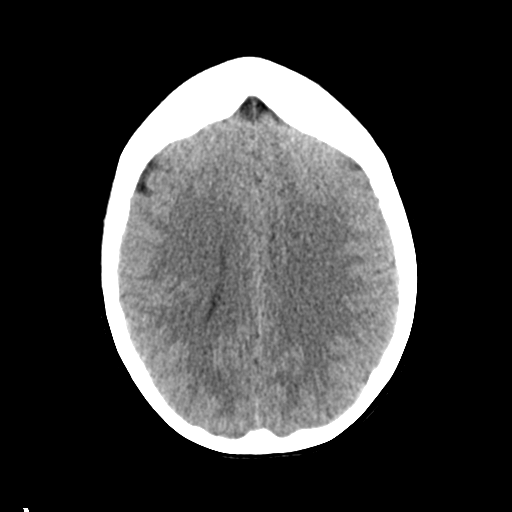
[im 20/30  brain]
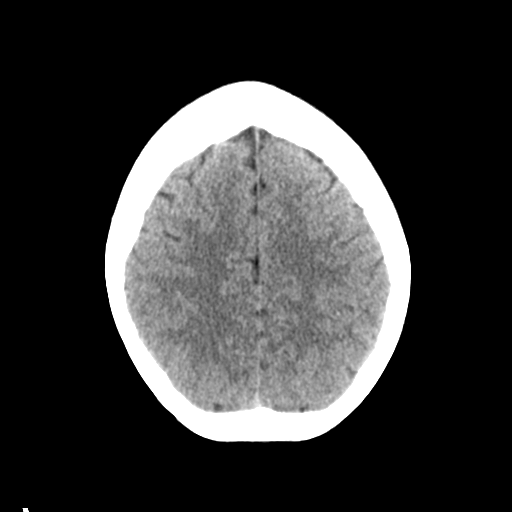
[im 23/30  brain]
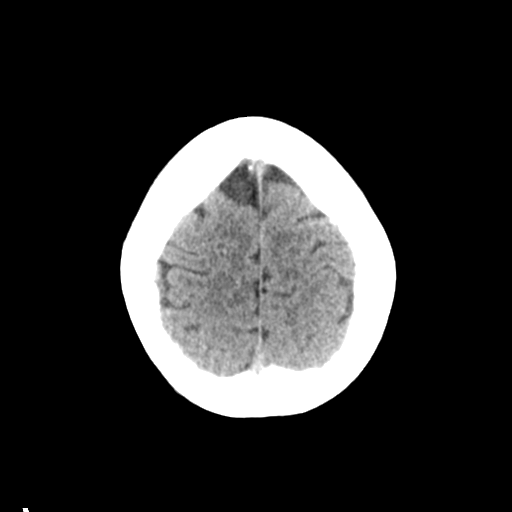
[im 25/30  brain]
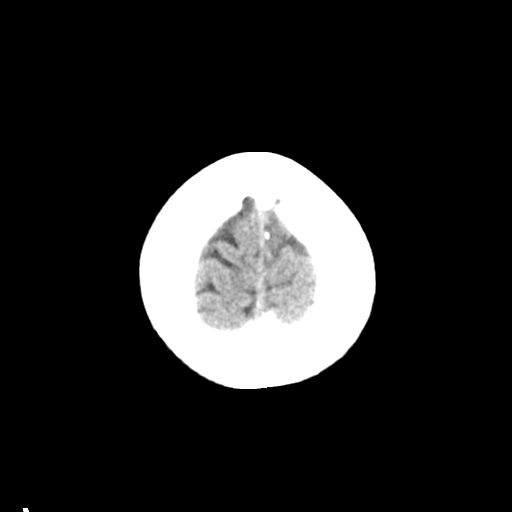
[im 25/30  bone]
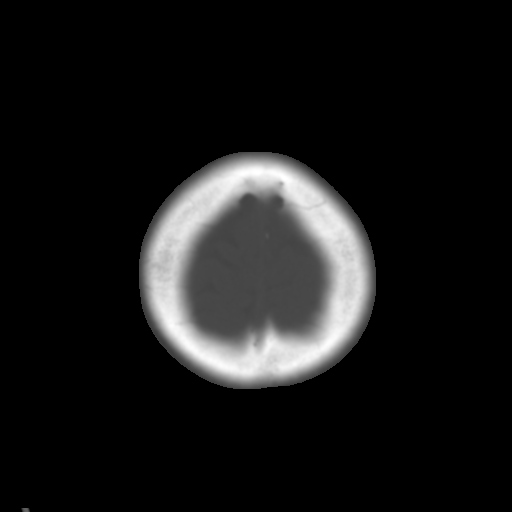
[im 28/30  brain]
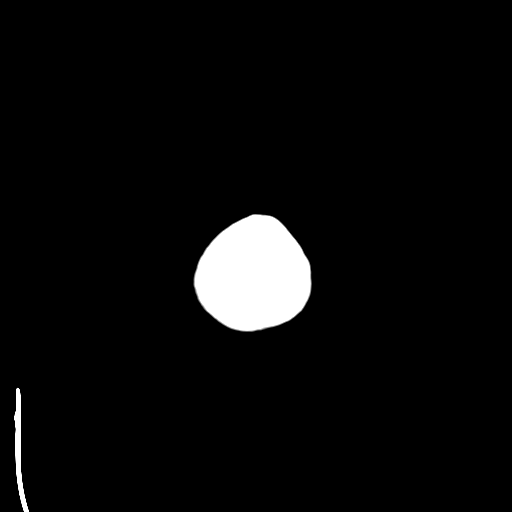

[Series 4: coronal soft tissue · coronal · 0.31mm/px · 3 of 65 slices shown]
[im 22/65  brain]
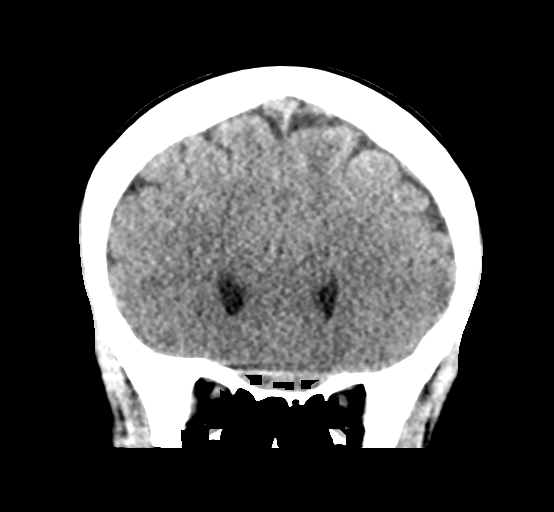
[im 29/65  brain]
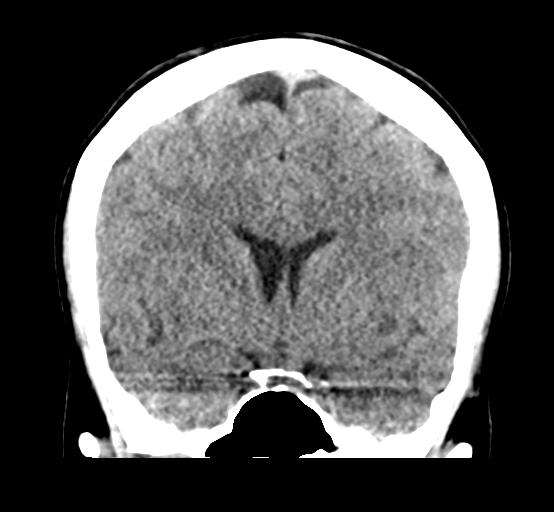
[im 36/65  brain]
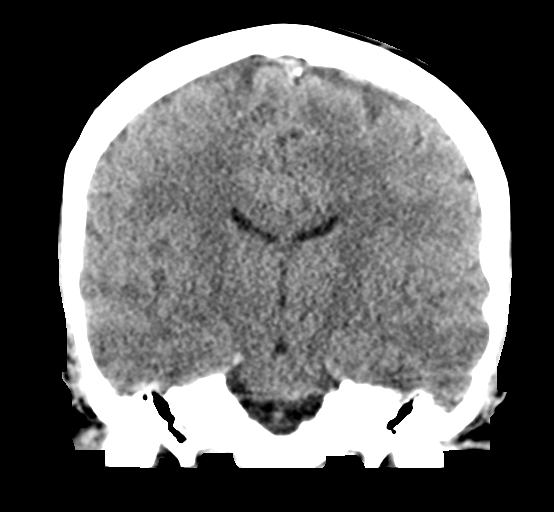

[Series 5: sagittal soft tissue · sagittal · 0.33mm/px · 3 of 50 slices shown]
[im 17/50  brain]
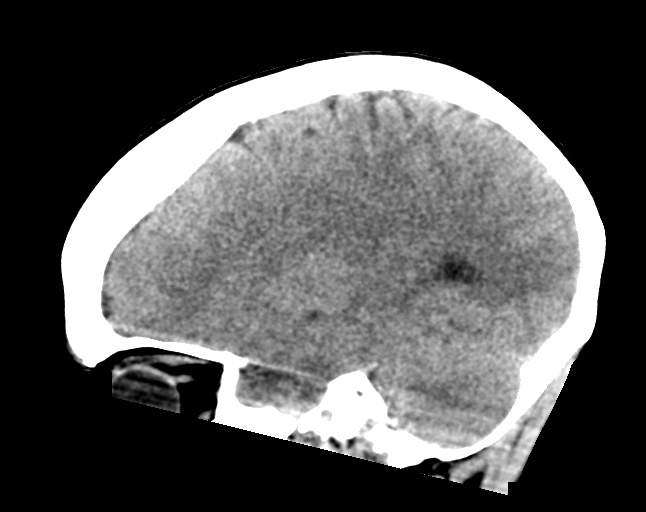
[im 25/50  brain]
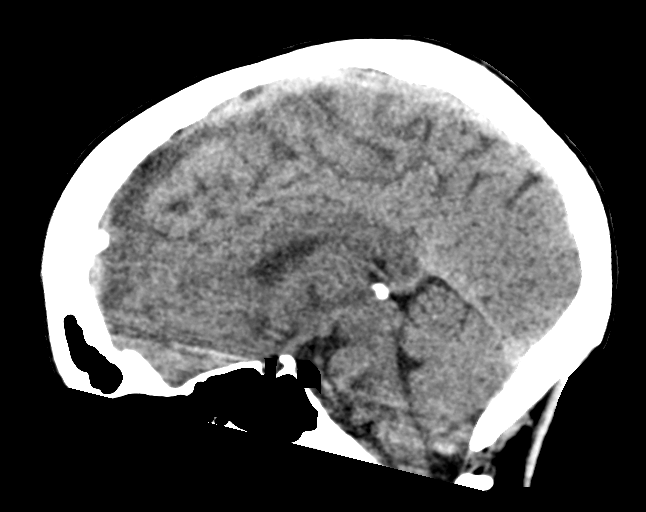
[im 33/50  brain]
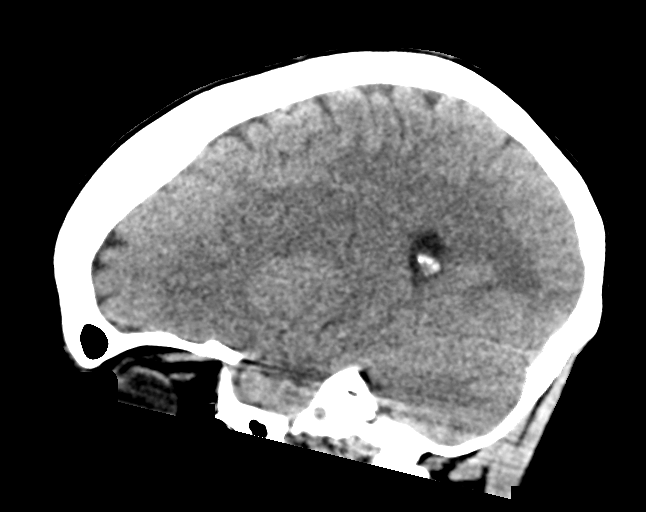

[16 of 47 positions shown; findings below may reference images not displayed]

FINDINGS: Brain: No evidence of acute infarction, hemorrhage, hydrocephalus,
extra-axial collection or mass lesion/mass effect.

Vascular: No hyperdense vessel or unexpected calcification.

Skull: Normal. Negative for fracture or focal lesion.

Sinuses/Orbits: No acute finding.

Other: None.
IMPRESSION: Normal head CT.
